# Patient Record
Sex: Female | Born: 1965 | Race: White | Hispanic: No | Marital: Married | Smoking: Never smoker
Health system: Southern US, Community
[De-identification: ages and names within clinical notes are randomized; demographics above are authoritative.]

## PROBLEM LIST (undated history)

## (undated) DIAGNOSIS — Z5189 Encounter for other specified aftercare: Secondary | ICD-10-CM

## (undated) DIAGNOSIS — M6289 Other specified disorders of muscle: Secondary | ICD-10-CM

## (undated) DIAGNOSIS — E785 Hyperlipidemia, unspecified: Secondary | ICD-10-CM

## (undated) DIAGNOSIS — T7840XA Allergy, unspecified, initial encounter: Secondary | ICD-10-CM

## (undated) DIAGNOSIS — I10 Essential (primary) hypertension: Secondary | ICD-10-CM

## (undated) HISTORY — DX: Essential (primary) hypertension: I10

## (undated) HISTORY — DX: Hyperlipidemia, unspecified: E78.5

## (undated) HISTORY — DX: Encounter for other specified aftercare: Z51.89

## (undated) HISTORY — DX: Allergy, unspecified, initial encounter: T78.40XA

---

## 2006-07-26 ENCOUNTER — Ambulatory Visit: Payer: Self-pay | Admitting: Obstetrics and Gynecology

## 2007-09-27 ENCOUNTER — Ambulatory Visit: Payer: Self-pay

## 2008-05-01 ENCOUNTER — Ambulatory Visit: Payer: Self-pay | Admitting: Family Medicine

## 2008-12-17 ENCOUNTER — Ambulatory Visit: Payer: Self-pay

## 2008-12-27 ENCOUNTER — Ambulatory Visit: Payer: Self-pay

## 2009-07-08 ENCOUNTER — Ambulatory Visit: Payer: Self-pay

## 2009-12-19 ENCOUNTER — Ambulatory Visit: Payer: Self-pay

## 2009-12-26 ENCOUNTER — Ambulatory Visit: Payer: Self-pay

## 2010-02-23 ENCOUNTER — Ambulatory Visit: Payer: Self-pay | Admitting: Internal Medicine

## 2010-12-22 ENCOUNTER — Ambulatory Visit: Payer: Self-pay

## 2011-12-24 ENCOUNTER — Ambulatory Visit: Payer: Self-pay

## 2013-03-30 ENCOUNTER — Emergency Department: Payer: Self-pay | Admitting: Emergency Medicine

## 2014-06-26 DIAGNOSIS — Z8639 Personal history of other endocrine, nutritional and metabolic disease: Secondary | ICD-10-CM | POA: Insufficient documentation

## 2014-06-26 DIAGNOSIS — Z8632 Personal history of gestational diabetes: Secondary | ICD-10-CM | POA: Insufficient documentation

## 2014-06-26 DIAGNOSIS — M6289 Other specified disorders of muscle: Secondary | ICD-10-CM | POA: Insufficient documentation

## 2014-06-26 DIAGNOSIS — Z8759 Personal history of other complications of pregnancy, childbirth and the puerperium: Secondary | ICD-10-CM | POA: Insufficient documentation

## 2015-07-02 DIAGNOSIS — I10 Essential (primary) hypertension: Secondary | ICD-10-CM | POA: Insufficient documentation

## 2017-05-07 ENCOUNTER — Other Ambulatory Visit: Payer: Self-pay

## 2017-05-07 DIAGNOSIS — Z0000 Encounter for general adult medical examination without abnormal findings: Secondary | ICD-10-CM

## 2017-05-07 LAB — POCT URINALYSIS DIPSTICK
BILIRUBIN UA: NEGATIVE
Glucose, UA: NEGATIVE
KETONES UA: NEGATIVE
Leukocytes, UA: NEGATIVE
NITRITE UA: NEGATIVE
PH UA: 7.5 (ref 5.0–8.0)
Protein, UA: NEGATIVE
RBC UA: NEGATIVE
Spec Grav, UA: <=1.005 — AB (ref 1.010–1.025)
Urobilinogen, UA: 0.2 E.U./dL

## 2017-05-08 LAB — CMP12+LP+TP+TSH+6AC+CBC/D/PLT
A/G RATIO: 1.6 (ref 1.2–2.2)
ALBUMIN: 4.4 g/dL (ref 3.5–5.5)
ALT: 14 IU/L (ref 0–32)
AST: 20 IU/L (ref 0–40)
Alkaline Phosphatase: 93 IU/L (ref 39–117)
BASOS ABS: 0 10*3/uL (ref 0.0–0.2)
BUN/Creatinine Ratio: 13 (ref 9–23)
BUN: 11 mg/dL (ref 6–24)
Basos: 1 %
Bilirubin Total: 0.3 mg/dL (ref 0.0–1.2)
CALCIUM: 9.4 mg/dL (ref 8.7–10.2)
CHOLESTEROL TOTAL: 198 mg/dL (ref 100–199)
Chloride: 101 mmol/L (ref 96–106)
Chol/HDL Ratio: 3.5 ratio (ref 0.0–4.4)
Creatinine, Ser: 0.84 mg/dL (ref 0.57–1.00)
EOS (ABSOLUTE): 0.1 10*3/uL (ref 0.0–0.4)
Eos: 1 %
Estimated CHD Risk: 0.6 times avg. (ref 0.0–1.0)
FREE THYROXINE INDEX: 1.8 (ref 1.2–4.9)
GFR calc Af Amer: 94 mL/min/{1.73_m2} (ref >59)
GFR calc non Af Amer: 81 mL/min/{1.73_m2} (ref >59)
GGT: 10 IU/L (ref 0–60)
GLOBULIN, TOTAL: 2.7 g/dL (ref 1.5–4.5)
Glucose: 99 mg/dL (ref 65–99)
HDL: 57 mg/dL (ref >39)
Hematocrit: 39.2 % (ref 34.0–46.6)
Hemoglobin: 13.1 g/dL (ref 11.1–15.9)
IMMATURE GRANS (ABS): 0 10*3/uL (ref 0.0–0.1)
IRON: 105 ug/dL (ref 27–159)
Immature Granulocytes: 0 %
LDH: 193 IU/L (ref 119–226)
LDL Calculated: 123 mg/dL — ABNORMAL HIGH (ref 0–99)
LYMPHS: 29 %
Lymphocytes Absolute: 1.7 10*3/uL (ref 0.7–3.1)
MCH: 30.3 pg (ref 26.6–33.0)
MCHC: 33.4 g/dL (ref 31.5–35.7)
MCV: 91 fL (ref 79–97)
MONOS ABS: 0.5 10*3/uL (ref 0.1–0.9)
Monocytes: 9 %
NEUTROS ABS: 3.4 10*3/uL (ref 1.4–7.0)
NEUTROS PCT: 60 %
PHOSPHORUS: 3.4 mg/dL (ref 2.5–4.5)
PLATELETS: 336 10*3/uL (ref 150–379)
POTASSIUM: 4.2 mmol/L (ref 3.5–5.2)
RBC: 4.33 x10E6/uL (ref 3.77–5.28)
RDW: 13.6 % (ref 12.3–15.4)
Sodium: 141 mmol/L (ref 134–144)
T3 UPTAKE RATIO: 26 % (ref 24–39)
T4 TOTAL: 6.8 ug/dL (ref 4.5–12.0)
TRIGLYCERIDES: 91 mg/dL (ref 0–149)
TSH: 2.7 u[IU]/mL (ref 0.450–4.500)
Total Protein: 7.1 g/dL (ref 6.0–8.5)
Uric Acid: 3.7 mg/dL (ref 2.5–7.1)
VLDL Cholesterol Cal: 18 mg/dL (ref 5–40)
WBC: 5.7 10*3/uL (ref 3.4–10.8)

## 2017-05-08 LAB — VITAMIN D 25 HYDROXY (VIT D DEFICIENCY, FRACTURES): Vit D, 25-Hydroxy: 33.4 ng/mL (ref 30.0–100.0)

## 2017-05-19 ENCOUNTER — Ambulatory Visit: Payer: Self-pay | Admitting: Adult Health

## 2017-05-19 ENCOUNTER — Encounter: Payer: Self-pay | Admitting: Adult Health

## 2017-05-19 VITALS — BP 150/100 | HR 82 | Temp 99.1°F | Resp 16 | Ht 65.0 in | Wt 174.0 lb

## 2017-05-19 DIAGNOSIS — Z0000 Encounter for general adult medical examination without abnormal findings: Secondary | ICD-10-CM

## 2017-05-19 MED ORDER — HYDROCHLOROTHIAZIDE 25 MG PO TABS: 25.0000 mg | ORAL_TABLET | Freq: Every day | ORAL | 3 refills | Status: DC

## 2017-05-19 NOTE — Progress Notes (Addendum)
Subjective:    Patient ID: Natalie Vargas, female    DOB: [birth year], 51 y.o.   MRN: [id number]  HPI  Patient is here for annual exam. Last exam year and half away. Physician moved away to further location. Gynecology  last exam February 2017 denies any abnormal's other than history of pelvic floor weakness.  Looking for gynecologist now and her friend has referred her to one that she would like to see now and she will be calling to schedule appointment.  She denies any recent illness and reports she " feels well".  She reports exercising and is active. She drinks mostly water.   She reports she also had a dermatologist and was seen last year 2017 for skin check, she is currently in the process of finding a new dermatologist.   She was previously on Hydrochlorothiazide 25 mg one daily and she stopped this medication after her refills ran out because she no longer has a primary care physician. She reports that Hydrochlorothiazide has kept her blood pressure down well in the past and would like to try this first before trying any other medications.  She was unable totolerate Lisinopril due to cough per her report.  She reports last vision exam was 2 years ago and normal and she will be setting up an appointment for vision exam. She denies any changes in vision. Patient reports she has never had a colonoscopy.  Last Mammogram 2017 per patient and she will be scheduling this years. Denies any abnormal results.  Allergies  Allergen Reactions  . Penicillins Rash  . Sulfa Antibiotics Rash  . Lisinopril Cough   Social History   Social History  . Marital status: Married    Spouse name: N/A  . Number of children: N/A  . Years of education: N/A   Occupational History  . Not on file.   Social History Main Topics  . Smoking status: Never Smoker  . Smokeless tobacco: Never Used  . Alcohol use Not on file  . Drug use: No  . Sexual activity: Yes    Birth control/ protection: Condom      Comment: Vasectomy    Other Topics Concern  . Not on file   Social History Narrative  . No narrative on file   Review of Systems  Constitutional: Positive for fever (99.1 today but none reported at home/ patinet reports " I have been outside and it was hot, she walked over to appointment"). Negative for activity change, appetite change, chills, diaphoresis, fatigue and unexpected weight change.  HENT: Positive for congestion (at times ) and sneezing (daily ). Negative for dental problem, drooling, ear discharge, ear pain, facial swelling, hearing loss, mouth sores, nosebleeds, postnasal drip, rhinorrhea, sinus pain, sinus pressure, sore throat, tinnitus and trouble swallowing.   Eyes: Negative for photophobia, pain, discharge, redness, itching and visual disturbance.  Respiratory: Negative for apnea, cough, choking, chest tightness, shortness of breath, wheezing and stridor.   Cardiovascular: Negative for chest pain, palpitations and leg swelling.  Gastrointestinal: Negative for abdominal distention, abdominal pain, anal bleeding, blood in stool, constipation (occasionally / no meds ), diarrhea, nausea, rectal pain and vomiting.  Endocrine: Negative for cold intolerance, heat intolerance, polydipsia, polyphagia and polyuria.  Genitourinary: Positive for urgency (history of pelvic floor weakness/ Physical therapy for this in past and has improvements / Kegels/ increases with caffiene.). Negative for decreased urine volume, difficulty urinating, dyspareunia, dysuria, enuresis, flank pain, frequency, genital sores, hematuria, menstrual problem, pelvic pain, vaginal bleeding, vaginal  discharge and vaginal pain.  Musculoskeletal: Positive for arthralgias ("very mild now after physical therapy in right hip" has been x-ray). Negative for back pain, gait problem, joint swelling, myalgias, neck pain and neck stiffness.  Skin: Negative for color change, pallor, rash and wound.  Allergic/Immunologic:  Positive for environmental allergies. Negative for food allergies and immunocompromised state.  Neurological: Negative for dizziness, tremors, seizures, syncope, facial asymmetry, speech difficulty, weakness, light-headedness, numbness and headaches.  Hematological: Negative for adenopathy. Does not bruise/bleed easily.  Psychiatric/Behavioral: Negative for agitation, behavioral problems, confusion, decreased concentration, dysphoric mood, hallucinations, self-injury, sleep disturbance and suicidal ideas. The patient is not nervous/anxious and is not hyperactive.    No gynecology exams done in this office at this time/ patient is aware she will have to see gynecology if needed for pap smear and pelvic.        Objective:   Physical Exam  Constitutional: She is oriented to person, place, and time. She appears well-developed and well-nourished. She is active.  Non-toxic appearance. She does not have a sickly appearance. No distress.  HENT:  Head: Normocephalic and atraumatic.  Right Ear: External ear normal.  Left Ear: External ear normal.  Nose: Nose normal.  Mouth/Throat: Oropharynx is clear and moist. No oropharyngeal exudate.  Eyes: Pupils are equal, round, and reactive to light. Conjunctivae and EOM are normal. Right eye exhibits no discharge. Left eye exhibits no discharge. No scleral icterus.  Neck: Normal range of motion. Neck supple. No JVD present. No tracheal deviation present. No thyromegaly present.  Cardiovascular: Normal rate, regular rhythm, normal heart sounds and intact distal pulses.  Exam reveals no gallop and no friction rub.   No murmur heard. Pulmonary/Chest: Effort normal and breath sounds normal. No stridor. No respiratory distress. She has no wheezes. She has no rales. She exhibits no tenderness.  Abdominal: Soft. Bowel sounds are normal. She exhibits no distension and no mass. There is no tenderness. There is no rebound and no guarding.  Musculoskeletal: Normal range  of motion. She exhibits no edema, tenderness or deformity.  Patient moves on and off exam table without difficulty. She ambulates without difficulty.   Lymphadenopathy:       Head (right side): No submental, no submandibular, no tonsillar, no preauricular, no posterior auricular and no occipital adenopathy present.       Head (left side): No submental, no submandibular, no tonsillar, no preauricular, no posterior auricular and no occipital adenopathy present.    She has no cervical adenopathy.       Right cervical: No superficial cervical, no deep cervical and no posterior cervical adenopathy present.      Left cervical: No superficial cervical, no deep cervical and no posterior cervical adenopathy present.    She has no axillary adenopathy.       Right axillary: No pectoral and no lateral adenopathy present.       Left axillary: No pectoral and no lateral adenopathy present.      Right: No inguinal, no supraclavicular and no epitrochlear adenopathy present.       Left: No inguinal, no supraclavicular and no epitrochlear adenopathy present.  Neurological: She is alert and oriented to person, place, and time. She has normal reflexes. She displays normal reflexes. No cranial nerve deficit or sensory deficit. She exhibits normal muscle tone. Coordination and gait normal. GCS eye subscore is 4. GCS verbal subscore is 5. GCS motor subscore is 6.  Skin: Skin is warm and dry. She is not diaphoretic. No pallor.  Psychiatric: She has a normal mood and affect. Her behavior is normal. Judgment and thought content normal. Cognition and memory are normal.  Vitals reviewed.         Assessment & Plan:   1. Blood Pressure is Elevated: Will resume HCTZ 25mg  one tablet daily today. Patient will monitor blood pressure at home daily and call the office or seek Emergency care if after hours and blood pressure remains high. Discussed emergency care for any chest pain, arm pain, radiation to neck, sob or nausea as  well as other associated symptoms. Patient will also be seen again in clinic week of 7/16 for office visit and recheck of blood pressure.  2. Patient will schedule Gynecology appointment and call the office if her contact does not work out as office will give referral. Patient will schedule Mammogram  3. Temperature 99.1 oral in office, patient will monitor at home and call the office if not resolved. Patient verbalized understanding.  4. LDL mildly elevated, discussed healthy diet. Will follow.  4. Patient will follow up with the office as needed and she verbalizes understanding.  Scheduled Meds: Continuous Infusions:  Meds ordered this encounter  Medications  . Calcium Carbonate-Vitamin D 600-400 MG-UNIT tablet    Sig: Take 1 tablet by mouth daily.   . Omega-3 Fatty Acids (FISH OIL) 1000 MG CAPS    Sig: Take 1,000 mg by mouth daily.  . hydrochlorothiazide (HYDRODIURIL) 25 MG tablet    Sig: Take 1 tablet (25 mg total) by mouth daily.    Dispense:  90 tablet    Refill:  3   She is also taking another Vitamin D3 over the counter- she will verify dosage.

## 2017-05-20 ENCOUNTER — Ambulatory Visit: Payer: Self-pay | Admitting: Medical

## 2017-05-21 ENCOUNTER — Telehealth: Payer: Self-pay

## 2017-05-21 NOTE — Telephone Encounter (Signed)
Please see Outgoing phone message.

## 2017-05-21 NOTE — Patient Instructions (Addendum)
Blood Pressure is Elevated: Will resume HCTZ 25mg  one tablet daily today. Patient will monitor blood pressure at home daily and call the office or seek Emergency care if after hours and blood pressure remains high. Discussed emergency care for any chest pain, arm pain, radiation to neck, sob or nausea as well as other associated symptoms. Patient will also be seen again in clinic week of 7/16 for office visit and recheck of blood pressure.  Fat and Cholesterol Restricted Diet Getting too much fat and cholesterol in your diet may cause health problems. Following this diet helps keep your fat and cholesterol at normal levels. This can keep you from getting sick. What types of fat should I choose?  Choose monosaturated and polyunsaturated fats. These are found in foods such as olive oil, canola oil, flaxseeds, walnuts, almonds, and seeds.  Eat more omega-3 fats. Good choices include salmon, mackerel, sardines, tuna, flaxseed oil, and ground flaxseeds.  Limit saturated fats. These are in animal products such as meats, butter, and cream. They can also be in plant products such as palm oil, palm kernel oil, and coconut oil.  Avoid foods with partially hydrogenated oils in them. These contain trans fats. Examples of foods that have trans fats are stick margarine, some tub margarines, cookies, crackers, and other baked goods. What general guidelines do I need to follow?  Check food labels. Look for the words "trans fat" and "saturated fat."  When preparing a meal: ? Fill half of your plate with vegetables and green salads. ? Fill one fourth of your plate with whole grains. Look for the word "whole" as the first word in the ingredient list. ? Fill one fourth of your plate with lean protein foods.  Eat more foods that have fiber, like apples, carrots, beans, peas, and barley.  Eat more home-cooked foods. Eat less at restaurants and buffets.  Limit or avoid alcohol.  Limit foods high in starch and  sugar.  Limit fried foods.  Cook foods without frying them. Baking, boiling, grilling, and broiling are all great options.  Lose weight if you are overweight. Losing even a small amount of weight can help your overall health. It can also help prevent diseases such as diabetes and heart disease. What foods can I eat? Grains Whole grains, such as whole wheat or whole grain breads, crackers, cereals, and pasta. Unsweetened oatmeal, bulgur, barley, quinoa, or brown rice. Corn or whole wheat flour tortillas. Vegetables Fresh or frozen vegetables (raw, steamed, roasted, or grilled). Green salads. Fruits All fresh, canned (in natural juice), or frozen fruits. Meat and Other Protein Products Ground beef (85% or leaner), grass-fed beef, or beef trimmed of fat. Skinless chicken or [place]. Ground chicken or [place]. Pork trimmed of fat. All fish and seafood. Eggs. Dried beans, peas, or lentils. Unsalted nuts or seeds. Unsalted canned or dry beans. Dairy Low-fat dairy products, such as skim or 1% milk, 2% or reduced-fat cheeses, low-fat ricotta or cottage cheese, or plain low-fat yogurt. Fats and Oils Tub margarines without trans fats. Light or reduced-fat mayonnaise and salad dressings. Avocado. Olive, canola, sesame, or safflower oils. Natural peanut or almond butter (choose ones without added sugar and oil). The items listed above may not be a complete list of recommended foods or beverages. Contact your dietitian for more options. What foods are not recommended? Grains White bread. White pasta. White rice. Cornbread. Bagels, pastries, and croissants. Crackers that contain trans fat. Vegetables White potatoes. Corn. Creamed or fried vegetables. Vegetables in a cheese  sauce. Fruits Dried fruits. Canned fruit in light or heavy syrup. Fruit juice. Meat and Other Protein Products Fatty cuts of meat. Ribs, chicken wings, bacon, sausage, bologna, salami, chitterlings, fatback, hot dogs, bratwurst, and  packaged luncheon meats. Liver and organ meats. Dairy Whole or 2% milk, cream, half-and-half, and cream cheese. Whole milk cheeses. Whole-fat or sweetened yogurt. Full-fat cheeses. Nondairy creamers and whipped toppings. Processed cheese, cheese spreads, or cheese curds. Sweets and Desserts Corn syrup, sugars, honey, and molasses. Candy. Jam and jelly. Syrup. Sweetened cereals. Cookies, pies, cakes, donuts, muffins, and ice cream. Fats and Oils Butter, stick margarine, lard, shortening, ghee, or bacon fat. Coconut, palm kernel, or palm oils. Beverages Alcohol. Sweetened drinks (such as sodas, lemonade, and fruit drinks or punches). The items listed above may not be a complete list of foods and beverages to avoid. Contact your dietitian for more information. This information is not intended to replace advice given to you by your health care provider. Make sure you discuss any questions you have with your health care provider. Document Released: 04/26/2012 Document Revised: 07/02/2016 Document Reviewed: 01/25/2014 Elsevier Interactive Patient Education  2018 [employer].  Breast Self-Awareness Breast self-awareness means being familiar with how your breasts look and feel. It involves checking your breasts regularly and reporting any changes to your health care provider. Practicing breast self-awareness is important. A change in your breasts can be a sign of a serious medical problem. Being familiar with how your breasts look and feel allows you to find any problems early, when treatment is more likely to be successful. All women should practice breast self-awareness, including women who have had breast implants. How to do a breast self-exam One way to learn what is normal for your breasts and whether your breasts are changing is to do a breast self-exam. To do a breast self-exam: Look for Changes  1. Remove all the clothing above your waist. 2. Stand in front of a mirror in a room with good  lighting. 3. Put your hands on your hips. 4. Push your hands firmly downward. 5. Compare your breasts in the mirror. Look for differences between them (asymmetry), such as: ? Differences in shape. ? Differences in size. ? Puckers, dips, and bumps in one breast and not the other. 6. Look at each breast for changes in your skin, such as: ? Redness. ? Scaly areas. 7. Look for changes in your nipples, such as: ? Discharge. ? Bleeding. ? Dimpling. ? Redness. ? A change in position. Feel for Changes  Carefully feel your breasts for lumps and changes. It is best to do this while lying on your back on the floor and again while sitting or standing in the shower or tub with soapy water on your skin. Feel each breast in the following way:  Place the arm on the side of the breast you are examining above your head.  Feel your breast with the other hand.  Start in the nipple area and make  inch (2 cm) overlapping circles to feel your breast. Use the pads of your three middle fingers to do this. Apply light pressure, then medium pressure, then firm pressure. The light pressure will allow you to feel the tissue closest to the skin. The medium pressure will allow you to feel the tissue that is a little deeper. The firm pressure will allow you to feel the tissue close to the ribs.  Continue the overlapping circles, moving downward over the breast until you feel your ribs below  your breast.  Move one finger-width toward the center of the body. Continue to use the  inch (2 cm) overlapping circles to feel your breast as you move slowly up toward your collarbone.  Continue the up and down exam using all three pressures until you reach your armpit.  Write Down What You Find  Write down what is normal for each breast and any changes that you find. Keep a written record with breast changes or normal findings for each breast. By writing this information down, you do not need to depend only on memory for  size, tenderness, or location. Write down where you are in your menstrual cycle, if you are still menstruating. If you are having trouble noticing differences in your breasts, do not get discouraged. With time you will become more familiar with the variations in your breasts and more comfortable with the exam. How often should I examine my breasts? Examine your breasts every month. If you are breastfeeding, the best time to examine your breasts is after a feeding or after using a breast pump. If you menstruate, the best time to examine your breasts is 5-7 days after your period is over. During your period, your breasts are lumpier, and it may be more difficult to notice changes. When should I see my health care provider? See your health care provider if you notice:  A change in shape or size of your breasts or nipples.  A change in the skin of your breast or nipples, such as a reddened or scaly area.  Unusual discharge from your nipples.  A lump or thick area that was not there before.  Pain in your breasts.  Anything that concerns you.  This information is not intended to replace advice given to you by your health care provider. Make sure you discuss any questions you have with your health care provider. Document Released: 10/26/2005 Document Revised: 04/02/2016 Document Reviewed: 09/15/2015 Elsevier Interactive Patient Education  2018 [employer].  Mammogram A mammogram is an X-ray of the breasts that is done to check for changes that are not normal. This test can screen for and find any changes that may suggest breast cancer. This test can also help to find other changes and variations in the breast. What happens before the procedure?  Have this test done about 1-2 weeks after your period. This is usually when your breasts are the least tender.  If you are visiting a new doctor or clinic, send any past mammogram images to your new doctor's office.  Wash your breasts and under your  arms the day of the test.  Do not use deodorants, perfumes, lotions, or powders on the day of the test.  Take off any jewelry from your neck.  Wear clothes that you can change into and out of easily. What happens during the procedure?  You will undress from the waist up. You will put on a gown.  You will stand in front of the X-ray machine.  Each breast will be placed between two plastic or glass plates. The plates will press down on your breast for a few seconds. Try to stay as relaxed as possible. This does not cause any harm to your breasts. Any discomfort you feel will be very brief.  X-rays will be taken from different angles of each breast. The procedure may vary among doctors and hospitals. What happens after the procedure?  The mammogram will be looked at by a specialist (radiologist).  You may need to do  certain parts of the test again. This depends on the quality of the images.  Ask when your test results will be ready. Make sure you get your test results.  You may go back to your normal activities. This information is not intended to replace advice given to you by your health care provider. Make sure you discuss any questions you have with your health care provider. Document Released: 01/22/2009 Document Revised: 04/02/2016 Document Reviewed: 01/04/2015 Elsevier Interactive Patient Education  2018 [employer] Blood Pressure Record Sheet Your blood pressure on this visit to the emergency department or clinic is elevated. This does not necessarily mean you have high blood pressure (hypertension), but it does mean that your blood pressure needs to be rechecked. Many times your blood pressure can increase due to illness, pain, anxiety, or other factors. We recommend that you get a series of blood pressure readings done over a period of 5 days. It is best to get a reading in the morning and one in the evening. You should make sure to sit and relax for 1-5 minutes before the  reading is taken. Write the readings down and make a follow-up appointment with your health care provider to discuss the results. If there is not a free clinic or a drug store with a blood-pressure-taking machine near you, you can purchase blood-pressure-taking equipment from a drug store. Having one in the home allows you the convenience of taking your blood pressure while you are home and relaxed. Blood Pressure Log Date: _______________________  a.m. _____________________  p.m. _____________________  Date: _______________________  a.m. _____________________  p.m. _____________________  Date: _______________________  a.m. _____________________  p.m. _____________________  Date: _______________________  a.m. _____________________  p.m. _____________________  Date: _______________________  a.m. _____________________  p.m. _____________________  This information is not intended to replace advice given to you by your health care provider. Make sure you discuss any questions you have with your health care provider. Document Released: 07/25/2003 Document Revised: 10/09/2016 Document Reviewed: 12/19/2013 Elsevier Interactive Patient Education  2018 [employer].  Cholesterol Cholesterol is a white, waxy, fat-like substance that is needed by the human body in small amounts. The liver makes all the cholesterol we need. Cholesterol is carried from the liver by the blood through the blood vessels. Deposits of cholesterol (plaques) may build up on blood vessel (artery) walls. Plaques make the arteries narrower and stiffer. Cholesterol plaques increase the risk for heart attack and stroke. You cannot feel your cholesterol level even if it is very high. The only way to know that it is high is to have a blood test. Once you know your cholesterol levels, you should keep a record of the test results. Work with your health care provider to keep your levels in the desired range. What do the  results mean?  Total cholesterol is a rough measure of all the cholesterol in your blood.  LDL (low-density lipoprotein) is the "bad" cholesterol. This is the type that causes plaque to build up on the artery walls. You want this level to be low.  HDL (high-density lipoprotein) is the "good" cholesterol because it cleans the arteries and carries the LDL away. You want this level to be high.  Triglycerides are fat that the body can either burn for energy or store. High levels are closely linked to heart disease. What are the desired levels of cholesterol?  Total cholesterol below 200.  LDL below 100 for people who are at risk, below 70 for people at very high risk.  HDL above 40 is good. A level of 60 or higher is considered to be protective against heart disease.  Triglycerides below 150. How can I lower my cholesterol? Diet Follow your diet program as told by your health care provider.  Choose fish or white meat chicken and [place], roasted or baked. Limit fatty cuts of red meat, fried foods, and processed meats, such as sausage and lunch meats.  Eat lots of fresh fruits and vegetables.  Choose whole grains, beans, pasta, potatoes, and cereals.  Choose olive oil, corn oil, or canola oil, and use only small amounts.  Avoid butter, mayonnaise, shortening, or palm kernel oils.  Avoid foods with trans fats.  Drink skim or nonfat milk and eat low-fat or nonfat yogurt and cheeses. Avoid whole milk, cream, ice cream, egg yolks, and full-fat cheeses.  Healthier desserts include angel food cake, ginger snaps, animal crackers, hard candy, popsicles, and low-fat or nonfat frozen yogurt. Avoid pastries, cakes, pies, and cookies.  Exercise  Follow your exercise program as told by your health care provider. A regular program: ? Helps to decrease LDL and raise HDL. ? Helps with weight control.  Do things that increase your activity level, such as gardening, walking, and taking the  stairs.  Ask your health care provider about ways that you can be more active in your daily life.  Medicine  Take over-the-counter and prescription medicines only as told by your health care provider. ? Medicine may be prescribed by your health care provider to help lower cholesterol and decrease the risk for heart disease. This is usually done if diet and exercise have failed to bring down cholesterol levels. ? If you have several risk factors, you may need medicine even if your levels are normal.  This information is not intended to replace advice given to you by your health care provider. Make sure you discuss any questions you have with your health care provider. Document Released: 07/21/2001 Document Revised: 05/23/2016 Document Reviewed: 04/25/2016 Elsevier Interactive Patient Education  2017 [employer]  Hypertension Hypertension is another name for high blood pressure. High blood pressure forces your heart to work harder to pump blood. This can cause problems over time. There are two numbers in a blood pressure reading. There is a top number (systolic) over a bottom number (diastolic). It is best to have a blood pressure below 120/80. Healthy choices can help lower your blood pressure. You may need medicine to help lower your blood pressure if:  Your blood pressure cannot be lowered with healthy choices.  Your blood pressure is higher than 130/80.  Follow these instructions at home: Eating and drinking  If directed, follow the DASH eating plan. This diet includes: ? Filling half of your plate at each meal with fruits and vegetables. ? Filling one quarter of your plate at each meal with whole grains. Whole grains include whole wheat pasta, brown rice, and whole grain bread. ? Eating or drinking low-fat dairy products, such as skim milk or low-fat yogurt. ? Filling one quarter of your plate at each meal with low-fat (lean) proteins. Low-fat proteins include fish, skinless  chicken, eggs, beans, and tofu. ? Avoiding fatty meat, cured and processed meat, or chicken with skin. ? Avoiding premade or processed food.  Eat less than 1,500 mg of salt (sodium) a day.  Limit alcohol use to no more than 1 drink a day for nonpregnant women and 2 drinks a day for men. One drink equals 12 oz of beer, 5 oz  of wine, or 1 oz of hard liquor. Lifestyle  Work with your doctor to stay at a healthy weight or to lose weight. Ask your doctor what the best weight is for you.  Get at least 30 minutes of exercise that causes your heart to beat faster (aerobic exercise) most days of the week. This may include walking, swimming, or biking.  Get at least 30 minutes of exercise that strengthens your muscles (resistance exercise) at least 3 days a week. This may include lifting weights or pilates.  Do not use any products that contain nicotine or tobacco. This includes cigarettes and e-cigarettes. If you need help quitting, ask your doctor.  Check your blood pressure at home as told by your doctor.  Keep all follow-up visits as told by your doctor. This is important. Medicines  Take over-the-counter and prescription medicines only as told by your doctor. Follow directions carefully.  Do not skip doses of blood pressure medicine. The medicine does not work as well if you skip doses. Skipping doses also puts you at risk for problems.  Ask your doctor about side effects or reactions to medicines that you should watch for. Contact a doctor if:  You think you are having a reaction to the medicine you are taking.  You have headaches that keep coming back (recurring).  You feel dizzy.  You have swelling in your ankles.  You have trouble with your vision. Get help right away if:  You get a very bad headache.  You start to feel confused.  You feel weak or numb.  You feel faint.  You get very bad pain in your: ? Chest. ? Belly (abdomen).  You throw up (vomit) more than  once.  You have trouble breathing. Summary  Hypertension is another name for high blood pressure.  Making healthy choices can help lower blood pressure. If your blood pressure cannot be controlled with healthy choices, you may need to take medicine. This information is not intended to replace advice given to you by your health care provider. Make sure you discuss any questions you have with your health care provider. Document Released: 04/13/2008 Document Revised: 09/23/2016 Document Reviewed: 09/23/2016 Elsevier Interactive Patient Education  2018 [employer]  Low-Sodium Eating Plan Sodium, which is an element that makes up salt, helps you maintain a healthy balance of fluids in your body. Too much sodium can increase your blood pressure and cause fluid and waste to be held in your body. Your health care provider or dietitian may recommend following this plan if you have high blood pressure (hypertension), kidney disease, liver disease, or heart failure. Eating less sodium can help lower your blood pressure, reduce swelling, and protect your heart, liver, and kidneys. What are tips for following this plan? General guidelines Most people on this plan should limit their sodium intake to 1,500-2,000 mg (milligrams) of sodium each day. Reading food labels The Nutrition Facts label lists the amount of sodium in one serving of the food. If you eat more than one serving, you must multiply the listed amount of sodium by the number of servings. Choose foods with less than 140 mg of sodium per serving. Avoid foods with 300 mg of sodium or more per serving. Shopping Look for lower-sodium products, often labeled as "low-sodium" or "no salt added." Always check the sodium content even if foods are labeled as "unsalted" or "no salt added". Buy fresh foods. Avoid canned foods and premade or frozen meals. Avoid canned, cured, or processed meats  Buy breads that have less than 80 mg of sodium per  slice. Cooking Eat more home-cooked food and less restaurant, buffet, and fast food. Avoid adding salt when cooking. Use salt-free seasonings or herbs instead of table salt or sea salt. Check with your health care provider or pharmacist before using salt substitutes. Cook with plant-based oils, such as canola, sunflower, or olive oil. Meal planning When eating at a restaurant, ask that your food be prepared with less salt or no salt, if possible. Avoid foods that contain MSG (monosodium glutamate). MSG is sometimes added to [place] food, bouillon, and some canned foods. What foods are recommended? The items listed may not be a complete list. Talk with your dietitian about what dietary choices are best for you. Grains Low-sodium cereals, including oats, puffed wheat and rice, and shredded wheat. Low-sodium crackers. Unsalted rice. Unsalted pasta. Low-sodium bread. Whole-grain breads and whole-grain pasta. Vegetables Fresh or frozen vegetables. "No salt added" canned vegetables. "No salt added" tomato sauce and paste. Low-sodium or reduced-sodium tomato and vegetable juice. Fruits Fresh, frozen, or canned fruit. Fruit juice. Meats and other protein foods Fresh or frozen (no salt added) meat, poultry, seafood, and fish. Low-sodium canned tuna and salmon. Unsalted nuts. Dried peas, beans, and lentils without added salt. Unsalted canned beans. Eggs. Unsalted nut butters. Dairy Milk. Soy milk. Cheese that is naturally low in sodium, such as ricotta cheese, fresh mozzarella, or Swiss cheese Low-sodium or reduced-sodium cheese. Cream cheese. Yogurt. Fats and oils Unsalted butter. Unsalted margarine with no trans fat. Vegetable oils such as canola or olive oils. Seasonings and other foods Fresh and dried herbs and spices. Salt-free seasonings. Low-sodium mustard and ketchup. Sodium-free salad dressing. Sodium-free light mayonnaise. Fresh or refrigerated horseradish. Lemon juice. Vinegar. Homemade,  reduced-sodium, or low-sodium soups. Unsalted popcorn and pretzels. Low-salt or salt-free chips. What foods are not recommended? The items listed may not be a complete list. Talk with your dietitian about what dietary choices are best for you. Grains Instant hot cereals. Bread stuffing, pancake, and biscuit mixes. Croutons. Seasoned rice or pasta mixes. Noodle soup cups. Boxed or frozen macaroni and cheese. Regular salted crackers. Self-rising flour. Vegetables Sauerkraut, pickled vegetables, and relishes. Olives. [place] fries. Onion rings. Regular canned vegetables (not low-sodium or reduced-sodium). Regular canned tomato sauce and paste (not low-sodium or reduced-sodium). Regular tomato and vegetable juice (not low-sodium or reduced-sodium). Frozen vegetables in sauces. Meats and other protein foods Meat or fish that is salted, canned, smoked, spiced, or pickled. Bacon, ham, sausage, hotdogs, corned beef, chipped beef, packaged lunch meats, salt pork, jerky, pickled herring, anchovies, regular canned tuna, sardines, salted nuts. Dairy Processed cheese and cheese spreads. Cheese curds. Blue cheese. Feta cheese. String cheese. Regular cottage cheese. Buttermilk. Canned milk. Fats and oils Salted butter. Regular margarine. Ghee. Bacon fat. Seasonings and other foods Onion salt, garlic salt, seasoned salt, table salt, and sea salt. Canned and packaged gravies. Worcestershire sauce. Tartar sauce. Barbecue sauce. Teriyaki sauce. Soy sauce, including reduced-sodium. Steak sauce. Fish sauce. Oyster sauce. Cocktail sauce. Horseradish that you find on the shelf. Regular ketchup and mustard. Meat flavorings and tenderizers. Bouillon cubes. Hot sauce and Tabasco sauce. Premade or packaged marinades. Premade or packaged taco seasonings. Relishes. Regular salad dressings. Salsa. Potato and tortilla chips. Corn chips and puffs. Salted popcorn and pretzels. Canned or dried soups. Pizza. Frozen entrees and pot  pies. Summary Eating less sodium can help lower your blood pressure, reduce swelling, and protect your heart, liver, and kidneys. Most people on  this plan should limit their sodium intake to 1,500-2,000 mg (milligrams) of sodium each day. Canned, boxed, and frozen foods are high in sodium. Restaurant foods, fast foods, and pizza are also very high in sodium. You also get sodium by adding salt to food. Try to cook at home, eat more fresh fruits and vegetables, and eat less fast food, canned, processed, or prepared foods. This information is not intended to replace advice given to you by your health care provider. Make sure you discuss any questions you have with your health care provider. Document Released: 04/17/2002 Document Revised: 10/19/2016 Document Reviewed: 10/19/2016 [employer] Interactive Patient Education  2017 [employer]

## 2017-05-25 ENCOUNTER — Telehealth: Payer: Self-pay

## 2017-05-25 NOTE — Telephone Encounter (Signed)
See telephone call notes

## 2017-05-28 ENCOUNTER — Ambulatory Visit: Payer: Self-pay

## 2017-05-28 VITALS — BP 122/88 | HR 74

## 2017-05-28 DIAGNOSIS — Z0130 Encounter for examination of blood pressure without abnormal findings: Secondary | ICD-10-CM

## 2017-07-01 ENCOUNTER — Ambulatory Visit: Payer: Self-pay | Admitting: Adult Health

## 2017-07-01 ENCOUNTER — Encounter: Payer: Self-pay | Admitting: Adult Health

## 2017-07-01 VITALS — BP 122/82 | HR 74 | Temp 98.3°F

## 2017-07-01 DIAGNOSIS — I10 Essential (primary) hypertension: Secondary | ICD-10-CM

## 2017-07-01 DIAGNOSIS — E559 Vitamin D deficiency, unspecified: Secondary | ICD-10-CM

## 2017-07-01 DIAGNOSIS — Z0130 Encounter for examination of blood pressure without abnormal findings: Secondary | ICD-10-CM

## 2017-07-01 NOTE — Patient Instructions (Signed)
Heart-Healthy Eating Plan Heart-healthy meal planning includes:  Limiting unhealthy fats.  Increasing healthy fats.  Making other small dietary changes.  You may need to talk with your doctor or a diet specialist (dietitian) to create an eating plan that is right for you. What types of fat should I choose?  Choose healthy fats. These include olive oil and canola oil, flaxseeds, walnuts, almonds, and seeds.  Eat more omega-3 fats. These include salmon, mackerel, sardines, tuna, flaxseed oil, and ground flaxseeds. Try to eat fish at least twice each week.  Limit saturated fats. ? Saturated fats are often found in animal products, such as meats, butter, and cream. ? Plant sources of saturated fats include palm oil, palm kernel oil, and coconut oil.  Avoid foods with partially hydrogenated oils in them. These include stick margarine, some tub margarines, cookies, crackers, and other baked goods. These contain trans fats. What general guidelines do I need to follow?  Check food labels carefully. Identify foods with trans fats or high amounts of saturated fat.  Fill one half of your plate with vegetables and green salads. Eat 4-5 servings of vegetables per day. A serving of vegetables is: ? 1 cup of raw leafy vegetables. ?  cup of raw or cooked cut-up vegetables. ?  cup of vegetable juice.  Fill one fourth of your plate with whole grains. Look for the word "whole" as the first word in the ingredient list.  Fill one fourth of your plate with lean protein foods.  Eat 4-5 servings of fruit per day. A serving of fruit is: ? One medium whole fruit. ?  cup of dried fruit. ?  cup of fresh, frozen, or canned fruit. ?  cup of 100% fruit juice.  Eat more foods that contain soluble fiber. These include apples, broccoli, carrots, beans, peas, and barley. Try to get 20-30 g of fiber per day.  Eat more home-cooked food. Eat less restaurant, buffet, and fast food.  Limit or avoid  alcohol.  Limit foods high in starch and sugar.  Avoid fried foods.  Avoid frying your food. Try baking, boiling, grilling, or broiling it instead. You can also reduce fat by: ? Removing the skin from poultry. ? Removing all visible fats from meats. ? Skimming the fat off of stews, soups, and gravies before serving them. ? Steaming vegetables in water or broth.  Lose weight if you are overweight.  Eat 4-5 servings of nuts, legumes, and seeds per week: ? One serving of dried beans or legumes equals  cup after being cooked. ? One serving of nuts equals 1 ounces. ? One serving of seeds equals  ounce or one tablespoon.  You may need to keep track of how much salt or sodium you eat. This is especially true if you have high blood pressure. Talk with your doctor or dietitian to get more information. What foods can I eat? Grains Breads, including French, white, pita, wheat, raisin, rye, oatmeal, and Italian. Tortillas that are neither fried nor made with lard or trans fat. Low-fat rolls, including hotdog and hamburger buns and English muffins. Biscuits. Muffins. Waffles. Pancakes. Light popcorn. Whole-grain cereals. Flatbread. Melba toast. Pretzels. Breadsticks. Rusks. Low-fat snacks. Low-fat crackers, including oyster, saltine, matzo, graham, animal, and rye. Rice and pasta, including brown rice and pastas that are made with whole wheat. Vegetables All vegetables. Fruits All fruits, but limit coconut. Meats and Other Protein Sources Lean, well-trimmed beef, veal, pork, and lamb. Chicken and turkey without skin. All fish and shellfish.   Wild duck, rabbit, pheasant, and venison. Egg whites or low-cholesterol egg substitutes. Dried beans, peas, lentils, and tofu. Seeds and most nuts. Dairy Low-fat or nonfat cheeses, including ricotta, string, and mozzarella. Skim or 1% milk that is liquid, powdered, or evaporated. Buttermilk that is made with low-fat milk. Nonfat or low-fat  yogurt. Beverages Mineral water. Diet carbonated beverages. Sweets and Desserts Sherbets and fruit ices. Honey, jam, marmalade, jelly, and syrups. Meringues and gelatins. Pure sugar candy, such as hard candy, jelly beans, gumdrops, mints, marshmallows, and small amounts of dark chocolate. [employer]. Eat all sweets and desserts in moderation. Fats and Oils Nonhydrogenated (trans-free) margarines. Vegetable oils, including soybean, sesame, sunflower, olive, peanut, safflower, corn, canola, and cottonseed. Salad dressings or mayonnaise made with a vegetable oil. Limit added fats and oils that you use for cooking, baking, salads, and as spreads. Other Cocoa powder. Coffee and tea. All seasonings and condiments. The items listed above may not be a complete list of recommended foods or beverages. Contact your dietitian for more options. What foods are not recommended? Grains Breads that are made with saturated or trans fats, oils, or whole milk. Croissants. Butter rolls. Cheese breads. Sweet rolls. Donuts. Buttered popcorn. Chow mein noodles. High-fat crackers, such as cheese or butter crackers. Meats and Other Protein Sources Fatty meats, such as hotdogs, short ribs, sausage, spareribs, bacon, rib eye roast or steak, and mutton. High-fat deli meats, such as salami and bologna. Caviar. Domestic duck and goose. Organ meats, such as kidney, liver, sweetbreads, and heart. Dairy Cream, sour cream, cream cheese, and creamed cottage cheese. Whole-milk cheeses, including blue (bleu), [address], [place], [place], [address], [place], [address], cheddar, [place], and [place]. Whole or 2% milk that is liquid, evaporated, or condensed. Whole buttermilk. Cream sauce or high-fat cheese sauce. Yogurt that is made from whole milk. Beverages Regular sodas and juice drinks with added sugar. Sweets and Desserts Frosting. Pudding. Cookies. Cakes other than angel food cake. Candy that has milk chocolate or white  chocolate, hydrogenated fat, butter, coconut, or unknown ingredients. Buttered syrups. Full-fat ice cream or ice cream drinks. Fats and Oils Gravy that has suet, meat fat, or shortening. Cocoa butter, hydrogenated oils, palm oil, coconut oil, palm kernel oil. These can often be found in baked products, candy, fried foods, nondairy creamers, and whipped toppings. Solid fats and shortenings, including bacon fat, salt pork, lard, and butter. Nondairy cream substitutes, such as coffee creamers and sour cream substitutes. Salad dressings that are made of unknown oils, cheese, or sour cream. The items listed above may not be a complete list of foods and beverages to avoid. Contact your dietitian for more information. This information is not intended to replace advice given to you by your health care provider. Make sure you discuss any questions you have with your health care provider. Document Released: 04/26/2012 Document Revised: 04/02/2016 Document Reviewed: 04/19/2014 Elsevier Interactive Patient Education  2018 [employer]. Cooking With Less [employer] with less salt is one way to reduce the amount of sodium you get from food. Depending on your condition and overall health, your health care provider or diet and nutrition specialist (dietitian) may recommend that you reduce your sodium intake. Most people should have less than 2,300 milligrams (mg) of sodium each day. If you have high blood pressure (hypertension), you may need to limit your sodium to 1,500 mg each day. Follow the tips below to help reduce your sodium intake. What do I need to know about cooking with less salt? Shopping  Buy sodium-free or low-sodium products. Look for the following words on food labels: ? Low-sodium. ? Sodium-free. ? Reduced-sodium. ? No salt added. ? Unsalted.  Buy fresh or frozen vegetables. Avoid canned vegetables.  Avoid buying meats or protein foods that have been injected with broth or saline  solution.  Avoid cured or smoked meats, such as hot dogs, bacon, salami, ham, and bologna. Reading food labels  Check the food label before buying or using packaged ingredients.  Look for products with no more than 140 mg of sodium in one serving.  Do not choose foods with salt as one of the first three ingredients on the ingredients list. If salt is one of the first three ingredients, it usually means the item is high in sodium, because ingredients are listed in order of amount in the food item. Cooking  Use herbs, seasonings without salt, and spices as substitutes for salt in foods.  Use sodium-free baking soda when baking.  Grill, braise, or roast foods to add flavor with less salt.  Avoid adding salt to pasta, rice, or hot cereals while cooking.  Drain and rinse canned vegetables before use.  Avoid adding salt when cooking sweets and desserts.  Cook with low-sodium ingredients. What are some salt alternatives? The following are herbs, seasonings, and spices that can be used instead of salt to give taste to your food. Herbs should be fresh or dried. Do not choose packaged mixes. Next to the name of the herb, spice, or seasoning are some examples of foods you can pair it with. Herbs  Bay leaves - Soups, meat and vegetable dishes, and spaghetti sauce.  Basil - [place] dishes, soups, pasta, and fish dishes.  Cilantro - Meat, poultry, and vegetable dishes.  Chili powder - Marinades and Mexican dishes.  Chives - Salad dressings and potato dishes.  Cumin - Mexican dishes, couscous, and meat dishes.  Dill - Fish dishes, sauces, and salads.  Fennel - Meat and vegetable dishes, breads, and cookies.  Garlic (do not use garlic salt) - [place] dishes, meat dishes, salad dressings, and sauces.  Marjoram - Soups, potato dishes, and meat dishes.  Oregano - Pizza and spaghetti sauce.  Parsley - Salads, soups, pasta, and meat dishes.  Rosemary - [place] dishes, salad dressings,  soups, and red meats.  Saffron - Fish dishes, pasta, and some poultry dishes.  Sage - Stuffings and sauces.  Tarragon - Fish and Whole Foodspoultry dishes.  Thyme - Stuffing, meat, and fish dishes. Seasonings  Lemon juice - Fish dishes, poultry dishes, vegetables, and salads.  Vinegar - Salad dressings, vegetables, and fish dishes. Spices  Cinnamon - Sweet dishes, such as cakes, cookies, and puddings.  Cloves - Gingerbread, puddings, and marinades for meats.  Curry - Vegetable dishes, fish and poultry dishes, and stir-fry dishes.  Ginger - Vegetables dishes, fish dishes, and stir-fry dishes.  Nutmeg - Pasta, vegetables, poultry, fish dishes, and custard. What are some low-sodium ingredients and foods?  Fresh or frozen fruits and vegetables with no sauce added.  Fresh or frozen whole meats, poultry, and fish with no sauce added.  Eggs.  Noodles, pasta, quinoa, rice.  Shredded or puffed wheat or puffed rice.  Regular or quick oats.  Milk, yogurt, hard cheeses, and low-sodium cheeses. Good cheese choices include Swiss, [employer], and [address]zzarella. Always check the label for the serving size and sodium content.  Unsalted butter or margarine.  Unsalted nuts.  Sherbet or ice cream (keep to  cup per serving).  Homemade pudding.  Sodium-free baking soda and baking powder. This is not a complete list of low-sodium ingredients and foods. Contact your dietitian for more options. Summary  Cooking with less salt is one way to reduce the amount of sodium that you get from food.  Buy sodium-free or low-sodium products.  Check the food label before using or buying packaged ingredients.  Use herbs, seasonings without salt, and spices as substitutes for salt in foods. This information is not intended to replace advice given to you by your health care provider. Make sure you discuss any questions you have with your health care provider. Document Released: 10/26/2005 Document  Revised: 11/03/2016 Document Reviewed: 11/03/2016 Elsevier Interactive Patient Education  2017 Else[employer] Inc. DASH Eating Plan DASH stands for "Dietary Approaches to Stop Hypertension." The DASH eating plan is a healthy eating plan that has been shown to reduce high blood pressure (hypertension). It may also reduce your risk for type 2 diabetes, heart disease, and stroke. The DASH eating plan may also help with weight loss. What are tips for following this plan? General guidelines  Avoid eating more than 2,300 mg (milligrams) of salt (sodium) a day. If you have hypertension, you may need to reduce your sodium intake to 1,500 mg a day.  Limit alcohol intake to no more than 1 drink a day for nonpregnant women and 2 drinks a day for men. One drink equals 12 oz of beer, 5 oz of wine, or 1 oz of hard liquor.  Work with your health care provider to maintain a healthy body weight or to lose weight. Ask what an ideal weight is for you.  Get at least 30 minutes of exercise that causes your heart to beat faster (aerobic exercise) most days of the week. Activities may include walking, swimming, or biking.  Work with your health care provider or diet and nutrition specialist (dietitian) to adjust your eating plan to your individual calorie needs. Reading food labels  Check food labels for the amount of sodium per serving. Choose foods with less than 5 percent of the Daily Value of sodium. Generally, foods with less than 300 mg of sodium per serving fit into this eating plan.  To find whole grains, look for the word "whole" as the first word in the ingredient list. Shopping  Buy products labeled as "low-sodium" or "no salt added."  Buy fresh foods. Avoid canned foods and premade or frozen meals. Cooking  Avoid adding salt when cooking. Use salt-free seasonings or herbs instead of table salt or sea salt. Check with your health care provider or pharmacist before using salt substitutes.  Do not fry  foods. Cook foods using healthy methods such as baking, boiling, grilling, and broiling instead.  Cook with heart-healthy oils, such as olive, canola, soybean, or sunflower oil. Meal planning   Eat a balanced diet that includes: ? 5 or more servings of fruits and vegetables each day. At each meal, try to fill half of your plate with fruits and vegetables. ? Up to 6-8 servings of whole grains each day. ? Less than 6 oz of lean meat, poultry, or fish each day. A 3-oz serving of meat is about the same size as a deck of cards. One egg equals 1 oz. ? 2 servings of low-fat dairy each day. ? A serving of nuts, seeds, or beans 5 times each week. ? Heart-healthy fats. Healthy fats called Omega-3 fatty acids are found in foods such as flaxseeds and coldwater fish, like sardines, salmon, and mackerel.  Limit how much you eat of the following: ? Canned or prepackaged foods. ? Food that is high in trans fat, such as fried foods. ? Food that is high in saturated fat, such as fatty meat. ? Sweets, desserts, sugary drinks, and other foods with added sugar. ? Full-fat dairy products.  Do not salt foods before eating.  Try to eat at least 2 vegetarian meals each week.  Eat more home-cooked food and less restaurant, buffet, and fast food.  When eating at a restaurant, ask that your food be prepared with less salt or no salt, if possible. What foods are recommended? The items listed may not be a complete list. Talk with your dietitian about what dietary choices are best for you. Grains Whole-grain or whole-wheat bread. Whole-grain or whole-wheat pasta. Brown rice. [place]. Bulgur. Whole-grain and low-sodium cereals. Pita bread. Low-fat, low-sodium crackers. Whole-wheat flour tortillas. Vegetables Fresh or frozen vegetables (raw, steamed, roasted, or grilled). Low-sodium or reduced-sodium tomato and vegetable juice. Low-sodium or reduced-sodium tomato sauce and tomato paste. Low-sodium or  reduced-sodium canned vegetables. Fruits All fresh, dried, or frozen fruit. Canned fruit in natural juice (without added sugar). Meat and other protein foods Skinless chicken or [place]. Ground chicken or [place]. Pork with fat trimmed off. Fish and seafood. Egg whites. Dried beans, peas, or lentils. Unsalted nuts, nut butters, and seeds. Unsalted canned beans. Lean cuts of beef with fat trimmed off. Low-sodium, lean deli meat. Dairy Low-fat (1%) or fat-free (skim) milk. Fat-free, low-fat, or reduced-fat cheeses. Nonfat, low-sodium ricotta or cottage cheese. Low-fat or nonfat yogurt. Low-fat, low-sodium cheese. Fats and oils Soft margarine without trans fats. Vegetable oil. Low-fat, reduced-fat, or light mayonnaise and salad dressings (reduced-sodium). Canola, safflower, olive, soybean, and sunflower oils. Avocado. Seasoning and other foods Herbs. Spices. Seasoning mixes without salt. Unsalted popcorn and pretzels. Fat-free sweets. What foods are not recommended? The items listed may not be a complete list. Talk with your dietitian about what dietary choices are best for you. Grains Baked goods made with fat, such as croissants, muffins, or some breads. Dry pasta or rice meal packs. Vegetables Creamed or fried vegetables. Vegetables in a cheese sauce. Regular canned vegetables (not low-sodium or reduced-sodium). Regular canned tomato sauce and paste (not low-sodium or reduced-sodium). Regular tomato and vegetable juice (not low-sodium or reduced-sodium). Angie Fava. Olives. Fruits Canned fruit in a light or heavy syrup. Fried fruit. Fruit in cream or butter sauce. Meat and other protein foods Fatty cuts of meat. Ribs. Fried meat. Berniece Salines. Sausage. Bologna and other processed lunch meats. Salami. Fatback. Hotdogs. Bratwurst. Salted nuts and seeds. Canned beans with added salt. Canned or smoked fish. Whole eggs or egg yolks. Chicken or [place] with skin. Dairy Whole or 2% milk, cream, and half-and-half.  Whole or full-fat cream cheese. Whole-fat or sweetened yogurt. Full-fat cheese. Nondairy creamers. Whipped toppings. Processed cheese and cheese spreads. Fats and oils Butter. Stick margarine. Lard. Shortening. Ghee. Bacon fat. Tropical oils, such as coconut, palm kernel, or palm oil. Seasoning and other foods Salted popcorn and pretzels. Onion salt, garlic salt, seasoned salt, table salt, and sea salt. Worcestershire sauce. Tartar sauce. Barbecue sauce. Teriyaki sauce. Soy sauce, including reduced-sodium. Steak sauce. Canned and packaged gravies. Fish sauce. Oyster sauce. Cocktail sauce. Horseradish that you find on the shelf. Ketchup. Mustard. Meat flavorings and tenderizers. Bouillon cubes. Hot sauce and Tabasco sauce. Premade or packaged marinades. Premade or packaged taco seasonings. Relishes. Regular salad dressings. Where to find more information:  National Heart, Lung, and Blood  Institute: PopSteam.is  American Heart Association: www.heart.org Summary  The DASH eating plan is a healthy eating plan that has been shown to reduce high blood pressure (hypertension). It may also reduce your risk for type 2 diabetes, heart disease, and stroke.  With the DASH eating plan, you should limit salt (sodium) intake to 2,300 mg a day. If you have hypertension, you may need to reduce your sodium intake to 1,500 mg a day.  When on the DASH eating plan, aim to eat more fresh fruits and vegetables, whole grains, lean proteins, low-fat dairy, and heart-healthy fats.  Work with your health care provider or diet and nutrition specialist (dietitian) to adjust your eating plan to your individual calorie needs. This information is not intended to replace advice given to you by your health care provider. Make sure you discuss any questions you have with your health care provider. Document Released: 10/15/2011 Document Revised: 10/19/2016 Document Reviewed: 10/19/2016 Elsevier Interactive Patient Education   2017 [employer] Inc. Hypertension Hypertension, commonly called high blood pressure, is when the force of blood pumping through the arteries is too strong. The arteries are the blood vessels that carry blood from the heart throughout the body. Hypertension forces the heart to work harder to pump blood and may cause arteries to become narrow or stiff. Having untreated or uncontrolled hypertension can cause heart attacks, strokes, kidney disease, and other problems. A blood pressure reading consists of a higher number over a lower number. Ideally, your blood pressure should be below 120/80. The first ("top") number is called the systolic pressure. It is a measure of the pressure in your arteries as your heart beats. The second ("bottom") number is called the diastolic pressure. It is a measure of the pressure in your arteries as the heart relaxes. What are the causes? The cause of this condition is not known. What increases the risk? Some risk factors for high blood pressure are under your control. Others are not. Factors you can change  Smoking.  Having type 2 diabetes mellitus, high cholesterol, or both.  Not getting enough exercise or physical activity.  Being overweight.  Having too much fat, sugar, calories, or salt (sodium) in your diet.  Drinking too much alcohol. Factors that are difficult or impossible to change  Having chronic kidney disease.  Having a family history of high blood pressure.  Age. Risk increases with age.  Race. You may be at higher risk if you are African-American.  Gender. Men are at higher risk than women before age 20. After age 36, women are at higher risk than men.  Having obstructive sleep apnea.  Stress. What are the signs or symptoms? Extremely high blood pressure (hypertensive crisis) may cause:  Headache.  Anxiety.  Shortness of breath.  Nosebleed.  Nausea and vomiting.  Severe chest pain.  Jerky movements you cannot control  (seizures).  How is this diagnosed? This condition is diagnosed by measuring your blood pressure while you are seated, with your arm resting on a surface. The cuff of the blood pressure monitor will be placed directly against the skin of your upper arm at the level of your heart. It should be measured at least twice using the same arm. Certain conditions can cause a difference in blood pressure between your right and left arms. Certain factors can cause blood pressure readings to be lower or higher than normal (elevated) for a short period of time:  When your blood pressure is higher when you are in a health care  provider's office than when you are at home, this is called white coat hypertension. Most people with this condition do not need medicines.  When your blood pressure is higher at home than when you are in a health care provider's office, this is called masked hypertension. Most people with this condition may need medicines to control blood pressure.  If you have a high blood pressure reading during one visit or you have normal blood pressure with other risk factors:  You may be asked to return on a different day to have your blood pressure checked again.  You may be asked to monitor your blood pressure at home for 1 week or longer.  If you are diagnosed with hypertension, you may have other blood or imaging tests to help your health care provider understand your overall risk for other conditions. How is this treated? This condition is treated by making healthy lifestyle changes, such as eating healthy foods, exercising more, and reducing your alcohol intake. Your health care provider may prescribe medicine if lifestyle changes are not enough to get your blood pressure under control, and if:  Your systolic blood pressure is above 130.  Your diastolic blood pressure is above 80.  Your personal target blood pressure may vary depending on your medical conditions, your age, and other  factors. Follow these instructions at home: Eating and drinking  Eat a diet that is high in fiber and potassium, and low in sodium, added sugar, and fat. An example eating plan is called the DASH (Dietary Approaches to Stop Hypertension) diet. To eat this way: ? Eat plenty of fresh fruits and vegetables. Try to fill half of your plate at each meal with fruits and vegetables. ? Eat whole grains, such as whole wheat pasta, brown rice, or whole grain bread. Fill about one quarter of your plate with whole grains. ? Eat or drink low-fat dairy products, such as skim milk or low-fat yogurt. ? Avoid fatty cuts of meat, processed or cured meats, and poultry with skin. Fill about one quarter of your plate with lean proteins, such as fish, chicken without skin, beans, eggs, and tofu. ? Avoid premade and processed foods. These tend to be higher in sodium, added sugar, and fat.  Reduce your daily sodium intake. Most people with hypertension should eat less than 1,500 mg of sodium a day.  Limit alcohol intake to no more than 1 drink a day for nonpregnant women and 2 drinks a day for men. One drink equals 12 oz of beer, 5 oz of wine, or 1 oz of hard liquor. Lifestyle  Work with your health care provider to maintain a healthy body weight or to lose weight. Ask what an ideal weight is for you.  Get at least 30 minutes of exercise that causes your heart to beat faster (aerobic exercise) most days of the week. Activities may include walking, swimming, or biking.  Include exercise to strengthen your muscles (resistance exercise), such as pilates or lifting weights, as part of your weekly exercise routine. Try to do these types of exercises for 30 minutes at least 3 days a week.  Do not use any products that contain nicotine or tobacco, such as cigarettes and e-cigarettes. If you need help quitting, ask your health care provider.  Monitor your blood pressure at home as told by your health care provider.  Keep  all follow-up visits as told by your health care provider. This is important. Medicines  Take over-the-counter and prescription medicines only as  told by your health care provider. Follow directions carefully. Blood pressure medicines must be taken as prescribed.  Do not skip doses of blood pressure medicine. Doing this puts you at risk for problems and can make the medicine less effective.  Ask your health care provider about side effects or reactions to medicines that you should watch for. Contact a health care provider if:  You think you are having a reaction to a medicine you are taking.  You have headaches that keep coming back (recurring).  You feel dizzy.  You have swelling in your ankles.  You have trouble with your vision. Get help right away if:  You develop a severe headache or confusion.  You have unusual weakness or numbness.  You feel faint.  You have severe pain in your chest or abdomen.  You vomit repeatedly.  You have trouble breathing. Summary  Hypertension is when the force of blood pumping through your arteries is too strong. If this condition is not controlled, it may put you at risk for serious complications.  Your personal target blood pressure may vary depending on your medical conditions, your age, and other factors. For most people, a normal blood pressure is less than 120/80.  Hypertension is treated with lifestyle changes, medicines, or a combination of both. Lifestyle changes include weight loss, eating a healthy, low-sodium diet, exercising more, and limiting alcohol. This information is not intended to replace advice given to you by your health care provider. Make sure you discuss any questions you have with your health care provider. Document Released: 10/26/2005 Document Revised: 09/23/2016 Document Reviewed: 09/23/2016 Elsevier Interactive Patient Education  2018 [employer]. Vitamin D Deficiency Vitamin D deficiency is when your body  does not have enough vitamin D. Vitamin D is important because:  It helps your body use other minerals that your body needs.  It helps keep your bones strong and healthy.  It may help to prevent some diseases.  It helps your heart and other muscles work well.  You can get vitamin D by:  Eating foods with vitamin D in them.  Drinking or eating milk or other foods that have had vitamin D added to them.  Taking a vitamin D supplement.  Being in the sun.  Not getting enough vitamin D can make your bones become soft. It can also cause other health problems. Follow these instructions at home:  Take medicines and supplements only as told by your doctor.  Eat foods that have vitamin D. These include: ? Dairy products, cereals, or juices with added vitamin D. Check the label for vitamin D. ? Fatty fish like salmon or trout. ? Eggs. ? Oysters.  Do not use tanning beds.  Stay at a healthy weight. Lose weight, if needed.  Keep all follow-up visits as told by your doctor. This is important. Contact a doctor if:  Your symptoms do not go away.  You feel sick to your stomach (nauseous).  Youthrow up (vomit).  You poop less often than usual or you have trouble pooping (constipation). This information is not intended to replace advice given to you by your health care provider. Make sure you discuss any questions you have with your health care provider. Document Released: 10/15/2011 Document Revised: 04/02/2016 Document Reviewed: 03/13/2015 Elsevier Interactive Patient Education  2018 [employer]. Tendinitis Tendinitis is swelling (inflammation) of a tendon. A tendon is cord of tissue that connects muscle to bone. Tendinitis can cause pain, tenderness, and swelling. It is usually treated with RICE  therapy. RICE stands for:  Rest.  Ice.  Compression. This means putting pressure on the affected area.  Elevation. This means raising the affected area above the level of your  heart.  Follow these instructions at home: If you have a splint or brace:  Wear the splint or brace as told by your doctor. Remove it only as told by your doctor.  Loosen the splint or brace if your fingers or toes tingle, become numb, or turn cold and blue.  Do not take baths, swim, or use a hot tub until your doctor approves. Ask your doctor if you can take showers. You may only be able to take sponge baths for bathing.  Do not let your splint or brace get wet if it is not waterproof. ? If your splint or brace is not waterproof, cover it with a watertight plastic bag when you take a bath or a shower.  Keep the splint or brace clean. Managing pain, stiffness, and swelling  If directed, apply ice to the affected area. ? Put ice in a plastic bag. ? Place a towel between your skin and the bag. ? Leave the ice on for 20 minutes, 2-3 times a day.  If directed, apply heat to the affected area as often as told by your doctor. Use the heat source that your doctor recommends. ? Place a towel between your skin and the heat source. ? Leave the heat on for 20-30 minutes. ? Take off the heat if your skin turns bright red. This is especially important if you are unable to feel pain, heat, or cold. You may have a greater risk of getting burned.  Move the fingers or toes of the affected arm or leg often, if this applies. This helps to prevent stiffness and to lessen swelling.  If directed, raise the affected area above the level of your heart while you are sitting or lying down. Driving  Do not drive or use heavy machinery while taking prescription pain medicine.  Ask your doctor when it is safe to drive if you have a splint or brace on any part of your arm or leg. Activity  Return to your normal activities as told by your doctor. Ask your doctor what activities are safe for you.  Rest the affected area as told by your doctor.  Avoid using the affected area while you have tendinitis.  Do  exercises (physical therapy) as told by your doctor. General instructions  If you have a splint, do not put pressure on any part of the splint until it is fully hardened. This may take several hours.  Wear an elastic bandage or pressure (compression) wrap only as told by your doctor.  Take over-the-counter and prescription medicines only as told by your doctor.  Keep all follow-up visits as told by your doctor. This is important. Contact a doctor if:  You do not get better.  You get new problems, such as numbness in your hands, and you do not know why. This information is not intended to replace advice given to you by your health care provider. Make sure you discuss any questions you have with your health care provider. Document Released: 02/05/2011 Document Revised: 06/25/2016 Document Reviewed: 07/29/2015 Elsevier Interactive Patient Education  [employer].

## 2017-07-01 NOTE — Progress Notes (Signed)
Subjective:     Patient ID: Natalie Vargas, female   DOB: 02-May-1966, 51 y.o.   MRN: [id number]  HPI  Patient is a 51 year old female who returns for follow up on blood pressure recheck. Denies any chest pain,leg swelling.  She denies any high readings at home. She inquires on how to take blood pressure as far as after exercise or rest.  She reports she is exercising more now and denies any shortness of breath or chest pain at rest or with exercise. She reports she is urinating normally.   She is taking 2,000 IU of Vitamin D 3 Daily and her Multivitamin with 400IU of Vitamin D 3 in it.      Current Outpatient Prescriptions:  .  Calcium Carbonate-Vitamin D 600-400 MG-UNIT tablet, Take 1 tablet by mouth daily. , Disp: , Rfl:  .  Cholecalciferol (VITAMIN D PO), Take 1,000 mg by mouth 2 (two) times daily., Disp: , Rfl:  .  hydrochlorothiazide (HYDRODIURIL) 25 MG tablet, Take 1 tablet (25 mg total) by mouth daily., Disp: 90 tablet, Rfl: 3 .  Omega-3 Fatty Acids (FISH OIL) 1000 MG CAPS, Take 1,000 mg by mouth daily., Disp: , Rfl:   Vitals:   07/01/17 0802  BP: 122/82  Pulse: 74  Temp: 98.3 F (36.8 C)  SpO2: 97%   Recheck Blood pressure by provider 124/76 Heart rate 76   Review of Systems  Constitutional: Negative.  Negative for activity change, appetite change, chills, diaphoresis, fatigue, fever and unexpected weight change.  HENT: Negative for congestion, dental problem, drooling, ear discharge, ear pain, facial swelling, hearing loss, mouth sores, nosebleeds, postnasal drip, rhinorrhea, sinus pain, sinus pressure, sneezing, sore throat, tinnitus, trouble swallowing and voice change.   Eyes: Negative for photophobia, pain, discharge, redness, itching and visual disturbance.  Respiratory: Negative for apnea, cough, choking, chest tightness, shortness of breath, wheezing and stridor.   Cardiovascular: Negative for chest pain, palpitations and leg swelling.  Gastrointestinal: Negative for  abdominal distention, abdominal pain, anal bleeding, blood in stool, constipation, diarrhea, nausea, rectal pain and vomiting.  Endocrine: Negative for cold intolerance, heat intolerance, polydipsia, polyphagia and polyuria.  Genitourinary: Negative for decreased urine volume, difficulty urinating, dyspareunia, dysuria, enuresis, flank pain, frequency, genital sores, hematuria, menstrual problem, pelvic pain, urgency, vaginal bleeding, vaginal discharge and vaginal pain.  Musculoskeletal: Negative for arthralgias, back pain, gait problem, joint swelling, myalgias, neck pain and neck stiffness.  Skin: Negative for color change, pallor, rash and wound.  Allergic/Immunologic: Negative for environmental allergies, food allergies and immunocompromised state.  Neurological: Negative for dizziness, tremors, seizures, syncope, facial asymmetry, speech difficulty, weakness, light-headedness, numbness and headaches.  Hematological: Negative for adenopathy. Does not bruise/bleed easily.  Psychiatric/Behavioral: Negative for agitation, behavioral problems, confusion, decreased concentration, dysphoric mood, hallucinations, self-injury, sleep disturbance and suicidal ideas. The patient is not nervous/anxious and is not hyperactive.        Objective:   Physical Exam  Constitutional: She is oriented to person, place, and time. She appears well-developed and well-nourished. No distress. She is not intubated.  HENT:  Head: Normocephalic and atraumatic.  Right Ear: Hearing, external ear and ear canal normal. No drainage or swelling. Tympanic membrane is not perforated and not erythematous. A middle ear effusion is present. No decreased hearing is noted.  Left Ear: Hearing, external ear and ear canal normal. No drainage or swelling. Tympanic membrane is not perforated and not erythematous. A middle ear effusion is present. No decreased hearing is noted.  Nose: No  mucosal edema or rhinorrhea.  Mouth/Throat: Uvula is  midline and oropharynx is clear and moist. Mucous membranes are not pale, not dry and not cyanotic. No oropharyngeal exudate.  Eyes: Pupils are equal, round, and reactive to light. Conjunctivae, EOM and lids are normal.  Neck: Trachea normal, normal range of motion and full passive range of motion without pain. Neck supple. Normal carotid pulses, no hepatojugular reflux and no JVD present. Carotid bruit is not present. No Brudzinski's sign and no Kernig's sign noted. No thyroid mass and no thyromegaly present.  Cardiovascular: Normal rate, regular rhythm, S1 normal, S2 normal and normal heart sounds.  Exam reveals no distant heart sounds and no friction rub.   Pulmonary/Chest: Effort normal and breath sounds normal. No apnea, no tachypnea and no bradypnea. She is not intubated.  Abdominal: Normal appearance.  Musculoskeletal: Normal range of motion.  Lymphadenopathy:       Head (right side): No submental, no submandibular, no tonsillar, no preauricular, no posterior auricular and no occipital adenopathy present.       Head (left side): No submental, no submandibular, no tonsillar, no preauricular, no posterior auricular and no occipital adenopathy present.    She has no cervical adenopathy.    She has no axillary adenopathy.  Neurological: She is alert and oriented to person, place, and time. She has normal strength. She displays normal reflexes. She displays a negative Romberg sign.  Patient moves on and off exam table without difficulty and gait is sure and steady in room. She makes eye contact and engages in conversation. Pleasant.   Skin: She is not diaphoretic.  Psychiatric: She has a normal mood and affect. Her speech is normal and behavior is normal. Judgment and thought content normal. Cognition and memory are normal.       Assessment:     1. Blood pressure check within JNC 8 Hypertensioin guidelines and patient is asymptomatic at this time.     Plan:  1. Continue hydrochlorothiazide  as below: recheck in office three months / October 2018. Do fasting labs before visit to recheck Vitamin D and kidney function.  Continue Vitamin D3 supplement and will recheck as above.  Current Outpatient Prescriptions:  .  Calcium Carbonate-Vitamin D 600-400 MG-UNIT tablet, Take 1 tablet by mouth daily. , Disp: , Rfl:  .  Cholecalciferol (VITAMIN D PO), Take 1,000 mg by mouth 2 (two) times daily., Disp: , Rfl:  .  hydrochlorothiazide (HYDRODIURIL) 25 MG tablet, Take 1 tablet (25 mg total) by mouth daily., Disp: 90 tablet, Rfl: 3 .  Omega-3 Fatty Acids (FISH OIL) 1000 MG CAPS, Take 1,000 mg by mouth daily., Disp: , Rfl:      Discussed patient needs to keep a log of blood pressure reading and bring to the office. Discussed current JNC 8 blood pressure guidelines and when to call if blood pressure elevated to make an appointment. Advised 911/  Emergency room for any chest pain, shortness of breath, neck pain or any unusual or new symptoms. Patient verbalized understanding and denies any questions at this time.   Future labs as follows:  Orders Placed This Encounter  Procedures  . CMP12+LP+TP+TSH+6AC+CBC/D/Plt    Due in October    Standing Status:   Future    Standing Expiration Date:   10/01/2017  . VITAMIN D 25 Hydroxy (Vit-D Deficiency, Fractures)    Due in October    Standing Status:   Future    Standing Expiration Date:   10/01/2017

## 2017-08-11 ENCOUNTER — Other Ambulatory Visit: Payer: Self-pay | Admitting: Obstetrics and Gynecology

## 2017-08-11 DIAGNOSIS — Z1231 Encounter for screening mammogram for malignant neoplasm of breast: Secondary | ICD-10-CM

## 2017-08-20 ENCOUNTER — Encounter (HOSPITAL_COMMUNITY): Payer: Self-pay

## 2017-08-20 ENCOUNTER — Ambulatory Visit
Admission: RE | Admit: 2017-08-20 | Discharge: 2017-08-20 | Disposition: A | Discharge status: Home / Self Care | Payer: BLUE CROSS/BLUE SHIELD | Source: Ambulatory Visit | Attending: Obstetrics and Gynecology | Admitting: Obstetrics and Gynecology

## 2017-08-20 DIAGNOSIS — Z1231 Encounter for screening mammogram for malignant neoplasm of breast: Secondary | ICD-10-CM | POA: Insufficient documentation

## 2017-08-24 ENCOUNTER — Other Ambulatory Visit: Payer: Self-pay | Admitting: Obstetrics and Gynecology

## 2017-08-24 DIAGNOSIS — N6489 Other specified disorders of breast: Secondary | ICD-10-CM

## 2017-08-24 DIAGNOSIS — R928 Other abnormal and inconclusive findings on diagnostic imaging of breast: Secondary | ICD-10-CM

## 2017-08-31 ENCOUNTER — Ambulatory Visit
Admission: RE | Admit: 2017-08-31 | Discharge: 2017-08-31 | Disposition: A | Discharge status: Home / Self Care | Payer: BLUE CROSS/BLUE SHIELD | Source: Ambulatory Visit | Attending: Obstetrics and Gynecology | Admitting: Obstetrics and Gynecology

## 2017-08-31 DIAGNOSIS — N6489 Other specified disorders of breast: Secondary | ICD-10-CM

## 2017-08-31 DIAGNOSIS — R928 Other abnormal and inconclusive findings on diagnostic imaging of breast: Secondary | ICD-10-CM

## 2017-09-03 ENCOUNTER — Other Ambulatory Visit: Payer: Self-pay

## 2017-09-03 DIAGNOSIS — Z0130 Encounter for examination of blood pressure without abnormal findings: Secondary | ICD-10-CM

## 2017-09-03 DIAGNOSIS — E559 Vitamin D deficiency, unspecified: Secondary | ICD-10-CM

## 2017-09-04 LAB — CMP12+LP+TP+TSH+6AC+CBC/D/PLT
ALBUMIN: 4.6 g/dL (ref 3.5–5.5)
ALT: 10 IU/L (ref 0–32)
AST: 17 IU/L (ref 0–40)
Albumin/Globulin Ratio: 1.8 (ref 1.2–2.2)
Alkaline Phosphatase: 80 IU/L (ref 39–117)
BUN/Creatinine Ratio: 17 (ref 9–23)
BUN: 16 mg/dL (ref 6–24)
Basophils Absolute: 0 10*3/uL (ref 0.0–0.2)
Basos: 0 %
Bilirubin Total: 0.3 mg/dL (ref 0.0–1.2)
CALCIUM: 9.3 mg/dL (ref 8.7–10.2)
CHOL/HDL RATIO: 3.3 ratio (ref 0.0–4.4)
CHOLESTEROL TOTAL: 198 mg/dL (ref 100–199)
Chloride: 101 mmol/L (ref 96–106)
Creatinine, Ser: 0.95 mg/dL (ref 0.57–1.00)
EOS (ABSOLUTE): 0.2 10*3/uL (ref 0.0–0.4)
Eos: 2 %
Estimated CHD Risk: <0.5 times avg. (ref 0.0–1.0)
FREE THYROXINE INDEX: 1.6 (ref 1.2–4.9)
GFR calc Af Amer: 80 mL/min/{1.73_m2} (ref >59)
GFR calc non Af Amer: 70 mL/min/{1.73_m2} (ref >59)
GGT: 11 IU/L (ref 0–60)
GLOBULIN, TOTAL: 2.5 g/dL (ref 1.5–4.5)
Glucose: 70 mg/dL (ref 65–99)
HDL: 60 mg/dL (ref >39)
Hematocrit: 38 % (ref 34.0–46.6)
Hemoglobin: 13.3 g/dL (ref 11.1–15.9)
IMMATURE GRANS (ABS): 0 10*3/uL (ref 0.0–0.1)
Immature Granulocytes: 0 %
Iron: 84 ug/dL (ref 27–159)
LDH: 220 IU/L (ref 119–226)
LDL Calculated: 122 mg/dL — ABNORMAL HIGH (ref 0–99)
LYMPHS ABS: 1.7 10*3/uL (ref 0.7–3.1)
LYMPHS: 27 %
MCH: 30.9 pg (ref 26.6–33.0)
MCHC: 35 g/dL (ref 31.5–35.7)
MCV: 88 fL (ref 79–97)
MONOS ABS: 0.7 10*3/uL (ref 0.1–0.9)
Monocytes: 12 %
NEUTROS ABS: 3.6 10*3/uL (ref 1.4–7.0)
Neutrophils: 59 %
PHOSPHORUS: 3.1 mg/dL (ref 2.5–4.5)
POTASSIUM: 4.7 mmol/L (ref 3.5–5.2)
Platelets: 335 10*3/uL (ref 150–379)
RBC: 4.3 x10E6/uL (ref 3.77–5.28)
RDW: 13.4 % (ref 12.3–15.4)
SODIUM: 141 mmol/L (ref 134–144)
T3 Uptake Ratio: 25 % (ref 24–39)
T4 TOTAL: 6.3 ug/dL (ref 4.5–12.0)
TRIGLYCERIDES: 82 mg/dL (ref 0–149)
TSH: 2.84 u[IU]/mL (ref 0.450–4.500)
Total Protein: 7.1 g/dL (ref 6.0–8.5)
Uric Acid: 5.4 mg/dL (ref 2.5–7.1)
VLDL Cholesterol Cal: 16 mg/dL (ref 5–40)
WBC: 6.2 10*3/uL (ref 3.4–10.8)

## 2017-09-04 LAB — VITAMIN D 25 HYDROXY (VIT D DEFICIENCY, FRACTURES): VIT D 25 HYDROXY: 44.8 ng/mL (ref 30.0–100.0)

## 2017-09-10 ENCOUNTER — Ambulatory Visit: Payer: Self-pay | Admitting: Adult Health

## 2017-09-10 VITALS — BP 110/78 | HR 71 | Temp 97.6°F | Resp 16 | Wt 173.0 lb

## 2017-09-10 DIAGNOSIS — Z719 Counseling, unspecified: Secondary | ICD-10-CM

## 2017-09-10 DIAGNOSIS — Z0000 Encounter for general adult medical examination without abnormal findings: Secondary | ICD-10-CM

## 2017-09-10 DIAGNOSIS — I10 Essential (primary) hypertension: Secondary | ICD-10-CM

## 2017-09-10 DIAGNOSIS — J018 Other acute sinusitis: Secondary | ICD-10-CM

## 2017-09-10 MED ORDER — AZITHROMYCIN 250 MG PO TABS: ORAL_TABLET | ORAL | 0 refills | Status: DC

## 2017-09-10 MED ORDER — HYDROCHLOROTHIAZIDE 25 MG PO TABS: 25.0000 mg | ORAL_TABLET | Freq: Every day | ORAL | 1 refills | Status: DC

## 2017-09-10 NOTE — Patient Instructions (Signed)
Sinusitis, Adult Sinusitis is soreness and inflammation of your sinuses. Sinuses are hollow spaces in the bones around your face. They are located:  Around your eyes.  In the middle of your forehead.  Behind your nose.  In your cheekbones.  Your sinuses and nasal passages are lined with a stringy fluid (mucus). Mucus normally drains out of your sinuses. When your nasal tissues get inflamed or swollen, the mucus can get trapped or blocked so air cannot flow through your sinuses. This lets bacteria, viruses, and funguses grow, and that leads to infection. Follow these instructions at home: Medicines  Take, use, or apply over-the-counter and prescription medicines only as told by your doctor. These may include nasal sprays.  If you were prescribed an antibiotic medicine, take it as told by your doctor. Do not stop taking the antibiotic even if you start to feel better. Hydrate and Humidify  Drink enough water to keep your pee (urine) clear or pale yellow.  Use a cool mist humidifier to keep the humidity level in your home above 50%.  Breathe in steam for 10-15 minutes, 3-4 times a day or as told by your doctor. You can do this in the bathroom while a hot shower is running.  Try not to spend time in cool or dry air. Rest  Rest as much as possible.  Sleep with your head raised (elevated).  Make sure to get enough sleep each night. General instructions  Put a warm, moist washcloth on your face 3-4 times a day or as told by your doctor. This will help with discomfort.  Wash your hands often with soap and water. If there is no soap and water, use hand sanitizer.  Do not smoke. Avoid being around people who are smoking (secondhand smoke).  Keep all follow-up visits as told by your doctor. This is important. Contact a doctor if:  You have a fever.  Your symptoms get worse.  Your symptoms do not get better within 10 days. Get help right away if:  You have a very bad  headache.  You cannot stop throwing up (vomiting).  You have pain or swelling around your face or eyes.  You have trouble seeing.  You feel confused.  Your neck is stiff.  You have trouble breathing. This information is not intended to replace advice given to you by your health care provider. Make sure you discuss any questions you have with your health care provider. Document Released: 04/13/2008 Document Revised: 06/21/2016 Document Reviewed: 08/21/2015 Elsevier Interactive Patient Education  2018 [employer]. Hypertension Hypertension, commonly called high blood pressure, is when the force of blood pumping through the arteries is too strong. The arteries are the blood vessels that carry blood from the heart throughout the body. Hypertension forces the heart to work harder to pump blood and may cause arteries to become narrow or stiff. Having untreated or uncontrolled hypertension can cause heart attacks, strokes, kidney disease, and other problems. A blood pressure reading consists of a higher number over a lower number. Ideally, your blood pressure should be below 120/80. The first ("top") number is called the systolic pressure. It is a measure of the pressure in your arteries as your heart beats. The second ("bottom") number is called the diastolic pressure. It is a measure of the pressure in your arteries as the heart relaxes. What are the causes? The cause of this condition is not known. What increases the risk? Some risk factors for high blood pressure are under your control.  Others are not. Factors you can change  Smoking.  Having type 2 diabetes mellitus, high cholesterol, or both.  Not getting enough exercise or physical activity.  Being overweight.  Having too much fat, sugar, calories, or salt (sodium) in your diet.  Drinking too much alcohol. Factors that are difficult or impossible to change  Having chronic kidney disease.  Having a family history of high  blood pressure.  Age. Risk increases with age.  Race. You may be at higher risk if you are African-American.  Gender. Men are at higher risk than women before age 11. After age 23, women are at higher risk than men.  Having obstructive sleep apnea.  Stress. What are the signs or symptoms? Extremely high blood pressure (hypertensive crisis) may cause:  Headache.  Anxiety.  Shortness of breath.  Nosebleed.  Nausea and vomiting.  Severe chest pain.  Jerky movements you cannot control (seizures).  How is this diagnosed? This condition is diagnosed by measuring your blood pressure while you are seated, with your arm resting on a surface. The cuff of the blood pressure monitor will be placed directly against the skin of your upper arm at the level of your heart. It should be measured at least twice using the same arm. Certain conditions can cause a difference in blood pressure between your right and left arms. Certain factors can cause blood pressure readings to be lower or higher than normal (elevated) for a short period of time:  When your blood pressure is higher when you are in a health care provider's office than when you are at home, this is called white coat hypertension. Most people with this condition do not need medicines.  When your blood pressure is higher at home than when you are in a health care provider's office, this is called masked hypertension. Most people with this condition may need medicines to control blood pressure.  If you have a high blood pressure reading during one visit or you have normal blood pressure with other risk factors:  You may be asked to return on a different day to have your blood pressure checked again.  You may be asked to monitor your blood pressure at home for 1 week or longer.  If you are diagnosed with hypertension, you may have other blood or imaging tests to help your health care provider understand your overall risk for other  conditions. How is this treated? This condition is treated by making healthy lifestyle changes, such as eating healthy foods, exercising more, and reducing your alcohol intake. Your health care provider may prescribe medicine if lifestyle changes are not enough to get your blood pressure under control, and if:  Your systolic blood pressure is above 130.  Your diastolic blood pressure is above 80.  Your personal target blood pressure may vary depending on your medical conditions, your age, and other factors. Follow these instructions at home: Eating and drinking  Eat a diet that is high in fiber and potassium, and low in sodium, added sugar, and fat. An example eating plan is called the DASH (Dietary Approaches to Stop Hypertension) diet. To eat this way: ? Eat plenty of fresh fruits and vegetables. Try to fill half of your plate at each meal with fruits and vegetables. ? Eat whole grains, such as whole wheat pasta, brown rice, or whole grain bread. Fill about one quarter of your plate with whole grains. ? Eat or drink low-fat dairy products, such as skim milk or low-fat yogurt. ?  Avoid fatty cuts of meat, processed or cured meats, and poultry with skin. Fill about one quarter of your plate with lean proteins, such as fish, chicken without skin, beans, eggs, and tofu. ? Avoid premade and processed foods. These tend to be higher in sodium, added sugar, and fat.  Reduce your daily sodium intake. Most people with hypertension should eat less than 1,500 mg of sodium a day.  Limit alcohol intake to no more than 1 drink a day for nonpregnant women and 2 drinks a day for men. One drink equals 12 oz of beer, 5 oz of wine, or 1 oz of hard liquor. Lifestyle  Work with your health care provider to maintain a healthy body weight or to lose weight. Ask what an ideal weight is for you.  Get at least 30 minutes of exercise that causes your heart to beat faster (aerobic exercise) most days of the week.  Activities may include walking, swimming, or biking.  Include exercise to strengthen your muscles (resistance exercise), such as pilates or lifting weights, as part of your weekly exercise routine. Try to do these types of exercises for 30 minutes at least 3 days a week.  Do not use any products that contain nicotine or tobacco, such as cigarettes and e-cigarettes. If you need help quitting, ask your health care provider.  Monitor your blood pressure at home as told by your health care provider.  Keep all follow-up visits as told by your health care provider. This is important. Medicines  Take over-the-counter and prescription medicines only as told by your health care provider. Follow directions carefully. Blood pressure medicines must be taken as prescribed.  Do not skip doses of blood pressure medicine. Doing this puts you at risk for problems and can make the medicine less effective.  Ask your health care provider about side effects or reactions to medicines that you should watch for. Contact a health care provider if:  You think you are having a reaction to a medicine you are taking.  You have headaches that keep coming back (recurring).  You feel dizzy.  You have swelling in your ankles.  You have trouble with your vision. Get help right away if:  You develop a severe headache or confusion.  You have unusual weakness or numbness.  You feel faint.  You have severe pain in your chest or abdomen.  You vomit repeatedly.  You have trouble breathing. Summary  Hypertension is when the force of blood pumping through your arteries is too strong. If this condition is not controlled, it may put you at risk for serious complications.  Your personal target blood pressure may vary depending on your medical conditions, your age, and other factors. For most people, a normal blood pressure is less than 120/80.  Hypertension is treated with lifestyle changes, medicines, or a  combination of both. Lifestyle changes include weight loss, eating a healthy, low-sodium diet, exercising more, and limiting alcohol. This information is not intended to replace advice given to you by your health care provider. Make sure you discuss any questions you have with your health care provider. Document Released: 10/26/2005 Document Revised: 09/23/2016 Document Reviewed: 09/23/2016 Elsevier Interactive Patient Education  2018 [employer] Cholesterol Cholesterol is a white, waxy, fat-like substance that is needed by the human body in small amounts. The liver makes all the cholesterol we need. Cholesterol is carried from the liver by the blood through the blood vessels. Deposits of cholesterol (plaques) may build up on blood vessel (  artery) walls. Plaques make the arteries narrower and stiffer. Cholesterol plaques increase the risk for heart attack and stroke. You cannot feel your cholesterol level even if it is very high. The only way to know that it is high is to have a blood test. Once you know your cholesterol levels, you should keep a record of the test results. Work with your health care provider to keep your levels in the desired range. What do the results mean?  Total cholesterol is a rough measure of all the cholesterol in your blood.  LDL (low-density lipoprotein) is the "bad" cholesterol. This is the type that causes plaque to build up on the artery walls. You want this level to be low.  HDL (high-density lipoprotein) is the "good" cholesterol because it cleans the arteries and carries the LDL away. You want this level to be high.  Triglycerides are fat that the body can either burn for energy or store. High levels are closely linked to heart disease. What are the desired levels of cholesterol?  Total cholesterol below 200.  LDL below 100 for people who are at risk, below 70 for people at very high risk.  HDL above 40 is good. A level of 60 or higher is considered to be  protective against heart disease.  Triglycerides below 150. How can I lower my cholesterol? Diet Follow your diet program as told by your health care provider.  Choose fish or white meat chicken and [place], roasted or baked. Limit fatty cuts of red meat, fried foods, and processed meats, such as sausage and lunch meats.  Eat lots of fresh fruits and vegetables.  Choose whole grains, beans, pasta, potatoes, and cereals.  Choose olive oil, corn oil, or canola oil, and use only small amounts.  Avoid butter, mayonnaise, shortening, or palm kernel oils.  Avoid foods with trans fats.  Drink skim or nonfat milk and eat low-fat or nonfat yogurt and cheeses. Avoid whole milk, cream, ice cream, egg yolks, and full-fat cheeses.  Healthier desserts include angel food cake, ginger snaps, animal crackers, hard candy, popsicles, and low-fat or nonfat frozen yogurt. Avoid pastries, cakes, pies, and cookies.  Exercise  Follow your exercise program as told by your health care provider. A regular program: ? Helps to decrease LDL and raise HDL. ? Helps with weight control.  Do things that increase your activity level, such as gardening, walking, and taking the stairs.  Ask your health care provider about ways that you can be more active in your daily life.  Medicine  Take over-the-counter and prescription medicines only as told by your health care provider. ? Medicine may be prescribed by your health care provider to help lower cholesterol and decrease the risk for heart disease. This is usually done if diet and exercise have failed to bring down cholesterol levels. ? If you have several risk factors, you may need medicine even if your levels are normal.  This information is not intended to replace advice given to you by your health care provider. Make sure you discuss any questions you have with your health care provider. Document Released: 07/21/2001 Document Revised: 05/23/2016 Document  Reviewed: 04/25/2016 Elsevier Interactive Patient Education  2017 [employer].

## 2017-09-10 NOTE — Progress Notes (Signed)
Subjective:    Patient ID: Natalie Vargas, female    DOB: January 03, 1966, 51 y.o.   MRN: [id number]  HPI  Patient is a 51 year old female in no acute distress, for blood pressure recheck and review of labs.   She also reports she has had nasal congestion and post nasal drip with facial fullness x 1 month.  She denies taking any over the counter medications.   LDL decreased one point to 123 .Vitamin D increasing.  Blood pressure checks at home reported to be around the same as here in office today.   Body mass index is 28.79 kg/m. Filed Weights   09/10/17 0758  Weight: 173 lb (78.5 kg)   Patient is doing aerobic exercise three to four  times a week, has decreased red meat.   She denies any fever,  rash, chest pain, shortness of breath, nausea, vomiting, edema or diarrhea.   She has seen GYN for urinary stress incontinence and was sent to [facility] to see specialist. She is scheduled for bladder sling December.    She has had her GYN exam and mammogram and will follow up accordingly per instructions by those providers.   She has no other concerns at this time.   Blood pressure 110/78, pulse 71, temperature 97.6 F (36.4 C), resp. rate 16, weight 173 lb (78.5 kg), last menstrual period 08/11/2017, SpO2 99 %.     Current Outpatient Prescriptions:  .  Calcium Carbonate-Vitamin D 600-400 MG-UNIT tablet, Take 1 tablet by mouth daily. , Disp: , Rfl:  .  Cholecalciferol (VITAMIN D PO), Take 1,000 mg by mouth 2 (two) times daily., Disp: , Rfl:  .  hydrochlorothiazide (HYDRODIURIL) 25 MG tablet, Take 1 tablet (25 mg total) by mouth daily., Disp: 90 tablet, Rfl: 3 .  Omega-3 Fatty Acids (FISH OIL) 1000 MG CAPS, Take 1,000 mg by mouth daily., Disp: , Rfl:  Review of Systems  Constitutional: Negative.   HENT: Positive for congestion, postnasal drip, rhinorrhea, sinus pressure (bilateral maxillary ) and sneezing. Negative for dental problem, drooling, ear discharge, ear pain, facial swelling,  hearing loss, mouth sores, nosebleeds, sinus pain, sore throat, tinnitus, trouble swallowing and voice change.   Eyes: Negative.   Respiratory: Negative for apnea, cough, choking, chest tightness, shortness of breath, wheezing and stridor.   Cardiovascular: Negative for chest pain, palpitations and leg swelling.  Gastrointestinal: Negative.   Endocrine: Negative.   Genitourinary: Negative.        Stress incontinence with exercise reported she is seeing specialist at [facility] that her GYN referred her to and has Bladder sling surgery scheduled for December 2018 per her report.  Musculoskeletal: Negative.   Skin: Negative.   Neurological: Negative.   Hematological: Negative.   Psychiatric/Behavioral: Negative.        Objective:   Physical Exam  Constitutional: She is oriented to person, place, and time. She appears well-developed and well-nourished. No distress.  HENT:  Head: Normocephalic and atraumatic.  Eyes: Pupils are equal, round, and reactive to light. Conjunctivae and EOM are normal.  Neck: Trachea normal, normal range of motion, full passive range of motion without pain and phonation normal. Neck supple. Normal carotid pulses, no hepatojugular reflux and no JVD present. Carotid bruit is not present. No tracheal deviation present.  Cardiovascular: Normal rate, regular rhythm, S1 normal, S2 normal, normal heart sounds and intact distal pulses.  Exam reveals no gallop and no friction rub.   No murmur heard. Pulmonary/Chest: Effort normal and breath sounds  normal. No accessory muscle usage or stridor. No respiratory distress. She has no wheezes. She has no rales. She exhibits no tenderness.  Abdominal: Soft. Normal aorta and bowel sounds are normal.  Musculoskeletal: Normal range of motion. She exhibits no edema.  Patient moves on and off of exam table and in room without difficulty. Gait is normal in hall and in room. Patient is oriented to person place time and situation. Patient answers  questions appropriately and engages in conversation.   Lymphadenopathy:    She has no cervical adenopathy.  Neurological: She is alert and oriented to person, place, and time. She has normal strength and normal reflexes. She displays normal reflexes. No cranial nerve deficit or sensory deficit. She exhibits normal muscle tone. She displays a negative Romberg sign. Coordination and gait normal. GCS eye subscore is 4. GCS verbal subscore is 5. GCS motor subscore is 6.  Skin: Skin is warm, dry and intact. No rash noted. She is not diaphoretic. No cyanosis or erythema. No pallor. Nails show no clubbing.          Assessment & Plan:  Hypertension, unspecified type  Acute non-recurrent sinusitis of other sinus  Health education   Meds ordered this encounter  Medications  . hydrochlorothiazide (HYDRODIURIL) 25 MG tablet    Sig: Take 1 tablet (25 mg total) by mouth daily.    Dispense:  90 tablet    Refill:  1  . azithromycin (ZITHROMAX) 250 MG tablet    Sig: Take 2 tablets ( 500 mg ) on day 1 and then take 250 mg one tablet on days 2, 3, 4, 5    Dispense:  6 tablet    Refill:  0   Return for office visit in 6 months for blood pressure check and follow up visit around May 2019 and sooner if needed.  If readings are high or low  per parameters given based off JNC 8 guidelines( hypertension)  or any new symptoms develop you need to call the office for an appointment and if after hours seek care in urgent care or emergency room. Call 911 with any emergent symptoms.  Will do labs one week prior.  I have already ordered this as a future order in the computer.  Orders Placed This Encounter  Procedures  . CMP12+LP+TP+TSH+6AC+CBC/D/Plt    Patient should be fasting.    Standing Status:   Future    Standing Expiration Date:   04/27/2018    Order Specific Question:   Has the patient fasted?    Answer:   Yes   Annual physical due after 05/19/18.   If sinusitis fails to improve :   Return to  clinic at any time  if any new symptoms change, worsen or do not improve. Symptoms should improve  within 72 hours and if not improving you should call for an appointment at the clinic is closed or be seen in urgent care/ED if clinic is closed. Follow up with your PCP.  AVS given to patient and reviewed.  Patient verbalized above understanding of all instructions and denies any questions currently.

## 2018-05-30 ENCOUNTER — Encounter: Payer: Self-pay | Admitting: Adult Health

## 2018-05-30 DIAGNOSIS — I10 Essential (primary) hypertension: Secondary | ICD-10-CM

## 2018-05-30 MED ORDER — HYDROCHLOROTHIAZIDE 25 MG PO TABS: 25.0000 mg | ORAL_TABLET | Freq: Every day | ORAL | 1 refills | Status: DC

## 2018-05-31 ENCOUNTER — Other Ambulatory Visit: Payer: Self-pay

## 2018-05-31 DIAGNOSIS — I10 Essential (primary) hypertension: Secondary | ICD-10-CM

## 2018-06-01 ENCOUNTER — Ambulatory Visit: Payer: Self-pay | Admitting: Adult Health

## 2018-06-01 LAB — CMP12+LP+TP+TSH+6AC+CBC/D/PLT
A/G RATIO: 1.8 (ref 1.2–2.2)
ALT: 6 IU/L (ref 0–32)
AST: 11 IU/L (ref 0–40)
Albumin: 4.1 g/dL (ref 3.5–5.5)
Alkaline Phosphatase: 69 IU/L (ref 39–117)
BILIRUBIN TOTAL: 0.4 mg/dL (ref 0.0–1.2)
BUN/Creatinine Ratio: 15 (ref 9–23)
BUN: 14 mg/dL (ref 6–24)
Basophils Absolute: 0 10*3/uL (ref 0.0–0.2)
Basos: 1 %
CHOL/HDL RATIO: 3.6 ratio (ref 0.0–4.4)
CREATININE: 0.95 mg/dL (ref 0.57–1.00)
Calcium: 9.2 mg/dL (ref 8.7–10.2)
Chloride: 102 mmol/L (ref 96–106)
Cholesterol, Total: 180 mg/dL (ref 100–199)
EOS (ABSOLUTE): 0.2 10*3/uL (ref 0.0–0.4)
ESTIMATED CHD RISK: 0.6 times avg. (ref 0.0–1.0)
Eos: 3 %
Free Thyroxine Index: 1.7 (ref 1.2–4.9)
GFR calc Af Amer: 80 mL/min/{1.73_m2} (ref >59)
GFR, EST NON AFRICAN AMERICAN: 70 mL/min/{1.73_m2} (ref >59)
GGT: 7 IU/L (ref 0–60)
GLOBULIN, TOTAL: 2.3 g/dL (ref 1.5–4.5)
Glucose: 95 mg/dL (ref 65–99)
HDL: 50 mg/dL (ref >39)
Hematocrit: 37.2 % (ref 34.0–46.6)
Hemoglobin: 12.5 g/dL (ref 11.1–15.9)
IRON: 103 ug/dL (ref 27–159)
Immature Grans (Abs): 0 10*3/uL (ref 0.0–0.1)
Immature Granulocytes: 0 %
LDH: 157 IU/L (ref 119–226)
LDL Calculated: 114 mg/dL — ABNORMAL HIGH (ref 0–99)
Lymphocytes Absolute: 1.4 10*3/uL (ref 0.7–3.1)
Lymphs: 25 %
MCH: 30.9 pg (ref 26.6–33.0)
MCHC: 33.6 g/dL (ref 31.5–35.7)
MCV: 92 fL (ref 79–97)
MONOS ABS: 0.6 10*3/uL (ref 0.1–0.9)
Monocytes: 11 %
NEUTROS ABS: 3.5 10*3/uL (ref 1.4–7.0)
Neutrophils: 60 %
POTASSIUM: 4 mmol/L (ref 3.5–5.2)
Phosphorus: 3.6 mg/dL (ref 2.5–4.5)
Platelets: 299 10*3/uL (ref 150–450)
RBC: 4.04 x10E6/uL (ref 3.77–5.28)
RDW: 13.8 % (ref 12.3–15.4)
SODIUM: 139 mmol/L (ref 134–144)
T3 Uptake Ratio: 28 % (ref 24–39)
T4, Total: 6.2 ug/dL (ref 4.5–12.0)
TRIGLYCERIDES: 82 mg/dL (ref 0–149)
TSH: 3.32 u[IU]/mL (ref 0.450–4.500)
Total Protein: 6.4 g/dL (ref 6.0–8.5)
Uric Acid: 4.3 mg/dL (ref 2.5–7.1)
VLDL Cholesterol Cal: 16 mg/dL (ref 5–40)
WBC: 5.8 10*3/uL (ref 3.4–10.8)

## 2018-06-01 LAB — HEMOGLOBIN A1C
ESTIMATED AVERAGE GLUCOSE: 117 mg/dL
Hgb A1c MFr Bld: 5.7 % — ABNORMAL HIGH (ref 4.8–5.6)

## 2018-06-01 LAB — VITAMIN D 25 HYDROXY (VIT D DEFICIENCY, FRACTURES): VIT D 25 HYDROXY: 62 ng/mL (ref 30.0–100.0)

## 2018-06-02 ENCOUNTER — Encounter: Payer: Self-pay | Admitting: Adult Health

## 2018-06-02 ENCOUNTER — Ambulatory Visit: Payer: Self-pay | Admitting: Adult Health

## 2018-06-02 VITALS — BP 115/82 | HR 71 | Temp 98.3°F | Resp 16 | Wt 144.6 lb

## 2018-06-02 DIAGNOSIS — Z0000 Encounter for general adult medical examination without abnormal findings: Secondary | ICD-10-CM

## 2018-06-02 DIAGNOSIS — I10 Essential (primary) hypertension: Secondary | ICD-10-CM

## 2018-06-02 DIAGNOSIS — R7303 Prediabetes: Secondary | ICD-10-CM

## 2018-06-02 MED ORDER — HYDROCHLOROTHIAZIDE 25 MG PO TABS: 25.0000 mg | ORAL_TABLET | Freq: Every day | ORAL | 1 refills | Status: DC

## 2018-06-02 NOTE — Progress Notes (Addendum)
Subjective:     Patient ID: Natalie Vargas, female   DOB: [birth year], 52 y.o.   MRN: [id number]  HPI   Blood pressure 115/82, pulse 71, temperature 98.3 F (36.8 C), temperature source Tympanic, resp. rate 16, weight 144 lb 9.6 oz (65.6 kg), SpO2 99 %.  Body mass index is 24.06 kg/m.    Patient  is a a 52 year old female in no acute distress who comes to the office for follow up and blood pressure recheck.  She reports she has been feeling well and has started weight watchers for the past " few months" and has had some intentional weight loss. She reports she is " feeling    Medication refill for hydrochlorothiazide, she has been taking for " years" without any side effects. She does have a mild rash allergy though she denies having any side effects while on hydrochlorothiazide.   Denies any rectal bleeding. She denies going  for colonoscopy last year as recommended.   She is due for repeat gynecology exam and mammogram after August 2019 and will call and schedule with her gynecologist for these.   Patient  denies any fever, body aches,chills, rash, chest pain, shortness of breath, nausea, vomiting, or diarrhea.   She sees eye doctor yearly.     Current Outpatient Medications:  .  Calcium Carbonate-Vitamin D 600-400 MG-UNIT tablet, Take 1 tablet by mouth daily. , Disp: , Rfl:  .  Cholecalciferol (VITAMIN D PO), Take 1,000 mg by mouth 2 (two) times daily., Disp: , Rfl:  .  hydrochlorothiazide (HYDRODIURIL) 25 MG tablet, Take 1 tablet (25 mg total) by mouth daily., Disp: 30 tablet, Rfl: 1 .  Omega-3 Fatty Acids (FISH OIL) 1000 MG CAPS, Take 1,000 mg by mouth daily., Disp: , Rfl:    Review of Systems  Constitutional: Negative.   HENT: Negative.   Eyes: Negative.   Respiratory: Negative.   Cardiovascular: Negative.   Gastrointestinal: Negative.   Endocrine: Negative.   Genitourinary: Negative.   Musculoskeletal: Negative.   Skin: Negative.   Allergic/Immunologic: Negative.    Neurological: Negative.   Hematological: Negative.   Psychiatric/Behavioral: Negative.        Objective:    Physical Exam  Constitutional: She is oriented to person, place, and time and well-developed, well-nourished, and in no distress. No distress.  Patient is alert and oriented and responsive to questions Engages in eye contact with provider. Speaks in full sentences without any pauses without any shortness of breath or distress.    Patient moves on and off of exam table and in room without difficulty. Gait is normal in hall and in room. Patient is oriented to person place time and situation. Patient answers questions appropriately and engages in conversation.   HENT:  Head: Normocephalic and atraumatic.  Right Ear: External ear normal.  Left Ear: External ear normal.  Nose: Nose normal.  Mouth/Throat: Oropharynx is clear and moist. No oropharyngeal exudate.  Eyes: Pupils are equal, round, and reactive to light. Conjunctivae and EOM are normal. Right eye exhibits no discharge. Left eye exhibits no discharge. No scleral icterus.  Neck: Normal range of motion. Neck supple. No JVD present. No tracheal deviation present. No thyromegaly present.  Cardiovascular: Normal rate, regular rhythm, normal heart sounds and intact distal pulses. Exam reveals no gallop and no friction rub.  No murmur heard. Pulmonary/Chest: Effort normal and breath sounds normal. No stridor. No respiratory distress. She has no wheezes. She has no rales. She exhibits no tenderness.  Abdominal: Soft. Normal appearance, normal aorta and bowel sounds are normal. She exhibits no shifting dullness, no distension, no pulsatile liver, no fluid wave, no abdominal bruit, no ascites, no pulsatile midline mass and no mass. There is no hepatosplenomegaly, splenomegaly or hepatomegaly. There is no tenderness. There is no rebound, no guarding and no CVA tenderness.  Genitourinary:  Genitourinary Comments: No gynecology exams done  in this office currently and no equipment available. Patient is aware she will have to see gynecology if needed for any pelvic/vaginal exam and will follow up with gynecology/obgyn as needed. Yearly gynecology pelvic exam recommended. Patient verbalized understanding of instructions and denies any further questions at this time.    Musculoskeletal: Normal range of motion. She exhibits no edema, tenderness or deformity.  Lymphadenopathy:       Head (right side): No submental, no submandibular, no tonsillar, no preauricular, no posterior auricular and no occipital adenopathy present.       Head (left side): No submental, no submandibular, no tonsillar, no preauricular, no posterior auricular and no occipital adenopathy present.    She has no cervical adenopathy.  Neurological: She is alert and oriented to person, place, and time. She has normal motor skills, normal sensation, normal strength and normal reflexes. She displays normal reflexes. No cranial nerve deficit. She exhibits normal muscle tone. Gait normal. Coordination normal. GCS score is 15.  Skin: Skin is warm, dry and intact. No rash noted. She is not diaphoretic. No erythema. No pallor. Nails show no clubbing.  Psychiatric: Mood, memory, affect and judgment normal.  Vitals reviewed.     Assessment:     Normal physical examination, routine      Plan:     Mildly elevated LDL - imp[roved from last LDL with diet control- reviewed all labs with patient.  Labs reviewed and education on nutrition and after visit summary reviewed in office.  Declines urinalysis dipstick at today's visit as she reprts she  will have done at gynecology  for routine screening in august per patient.  Refer for colonoscopy she will call if she has not heard from Dr. Servando Snare office within two weeks from today and this office will follow up.   Recommend yearly dermatology skin checks.   She will schedule gynecology and her gynecologist will order mammogram for  after August 2019. She has breast exams done at gynecology as well and declines at this office. 6 month below sent of medication as below:  Meds ordered this encounter  Medications  . hydrochlorothiazide (HYDRODIURIL) 25 MG tablet    Sig: Take 1 tablet (25 mg total) by mouth daily.    Dispense:  90 tablet    Refill:  1   Orders Placed This Encounter  Procedures  . Ambulatory referral to Gastroenterology    Referral Priority:   Routine    Referral Type:   Consultation    Referral Reason:   Specialty Services Required    Referred to Provider:   Midge Minium, MD    Number of Visits Requested:   1   Return to clinic in 6 months for recheck labs and follow up appointment the week after to recheck Hemoglobin A1C, glucose, vitamin D, and kidney function.   Orders Placed This Encounter  Procedures  . CMP12+LP+TP+TSH+6AC+CBC/D/Plt    Standing Status:   Future    Standing Expiration Date:   06/03/2019  . VITAMIN D 25 Hydroxy (Vit-D Deficiency, Fractures)    Standing Status:   Future    Standing Expiration  Date:   06/03/2019  . HgB A1c    Standing Status:   Future    Standing Expiration Date:   06/03/2019  . Ambulatory referral to Gastroenterology    Referral Priority:   Routine    Referral Type:   Consultation    Referral Reason:   Specialty Services Required    Referred to Provider:   Midge MiniumWohl, Darren, MD    Number of Visits Requested:   1      Advised patient call the office or your primary care doctor for an appointment if no improvement within 72 hours or if any symptoms change or worsen at any time  Advised ER or urgent Care if after hours or on weekend. Call 911 for emergency symptoms at any time.Patinet verbalized understanding of all instructions given/reviewed and treatment plan and has no further questions or concerns at this time.    Patient verbalized understanding of all instructions given and denies any further questions at this time.

## 2018-06-02 NOTE — Patient Instructions (Addendum)
Health Maintenance, Female Adopting a healthy lifestyle and getting preventive care can go a long way to promote health and wellness. Talk with your health care provider about what schedule of regular examinations is right for you. This is a good chance for you to check in with your provider about disease prevention and staying healthy. In between checkups, there are plenty of things you can do on your own. Experts have done a lot of research about which lifestyle changes and preventive measures are most likely to keep you healthy. Ask your health care provider for more information. Weight and diet Eat a healthy diet  Be sure to include plenty of vegetables, fruits, low-fat dairy products, and lean protein.  Do not eat a lot of foods high in solid fats, added sugars, or salt.  Get regular exercise. This is one of the most important things you can do for your health. ? Most adults should exercise for at least 150 minutes each week. The exercise should increase your heart rate and make you sweat (moderate-intensity exercise). ? Most adults should also do strengthening exercises at least twice a week. This is in addition to the moderate-intensity exercise.  Maintain a healthy weight  Body mass index (BMI) is a measurement that can be used to identify possible weight problems. It estimates body fat based on height and weight. Your health care provider can help determine your BMI and help you achieve or maintain a healthy weight.  For females 20 years of age and older: ? A BMI below 18.5 is considered underweight. ? A BMI of 18.5 to 24.9 is normal. ? A BMI of 25 to 29.9 is considered overweight. ? A BMI of 30 and above is considered obese.  Watch levels of cholesterol and blood lipids  You should start having your blood tested for lipids and cholesterol at 52 years of age, then have this test every 5 years.  You may need to have your cholesterol levels checked more often if: ? Your lipid or  cholesterol levels are high. ? You are older than 52 years of age. ? You are at high risk for heart disease.  Cancer screening Lung Cancer  Lung cancer screening is recommended for adults 55-80 years old who are at high risk for lung cancer because of a history of smoking.  A yearly low-dose CT scan of the lungs is recommended for people who: ? Currently smoke. ? Have quit within the past 15 years. ? Have at least a 30-pack-year history of smoking. A pack year is smoking an average of one pack of cigarettes a day for 1 year.  Yearly screening should continue until it has been 15 years since you quit.  Yearly screening should stop if you develop a health problem that would prevent you from having lung cancer treatment.  Breast Cancer  Practice breast self-awareness. This means understanding how your breasts normally appear and feel.  It also means doing regular breast self-exams. Let your health care provider know about any changes, no matter how small.  If you are in your 20s or 30s, you should have a clinical breast exam (CBE) by a health care provider every 1-3 years as part of a regular health exam.  If you are 40 or older, have a CBE every year. Also consider having a breast X-ray (mammogram) every year.  If you have a family history of breast cancer, talk to your health care provider about genetic screening.  If you are at high risk   for breast cancer, talk to your health care provider about having an MRI and a mammogram every year.  Breast cancer gene (BRCA) assessment is recommended for women who have family members with BRCA-related cancers. BRCA-related cancers include: ? Breast. ? Ovarian. ? Tubal. ? Peritoneal cancers.  Results of the assessment will determine the need for genetic counseling and BRCA1 and BRCA2 testing.  Cervical Cancer Your health care provider may recommend that you be screened regularly for cancer of the pelvic organs (ovaries, uterus, and  vagina). This screening involves a pelvic examination, including checking for microscopic changes to the surface of your cervix (Pap test). You may be encouraged to have this screening done every 3 years, beginning at age 22.  For women ages 56-65, health care providers may recommend pelvic exams and Pap testing every 3 years, or they may recommend the Pap and pelvic exam, combined with testing for human papilloma virus (HPV), every 5 years. Some types of HPV increase your risk of cervical cancer. Testing for HPV may also be done on women of any age with unclear Pap test results.  Other health care providers may not recommend any screening for nonpregnant women who are considered low risk for pelvic cancer and who do not have symptoms. Ask your health care provider if a screening pelvic exam is right for you.  If you have had past treatment for cervical cancer or a condition that could lead to cancer, you need Pap tests and screening for cancer for at least 20 years after your treatment. If Pap tests have been discontinued, your risk factors (such as having a new sexual partner) need to be reassessed to determine if screening should resume. Some women have medical problems that increase the chance of getting cervical cancer. In these cases, your health care provider may recommend more frequent screening and Pap tests.  Colorectal Cancer  This type of cancer can be detected and often prevented.  Routine colorectal cancer screening usually begins at 52 years of age and continues through 52 years of age.  Your health care provider may recommend screening at an earlier age if you have risk factors for colon cancer.  Your health care provider may also recommend using home test kits to check for hidden blood in the stool.  A small camera at the end of a tube can be used to examine your colon directly (sigmoidoscopy or colonoscopy). This is done to check for the earliest forms of colorectal  cancer.  Routine screening usually begins at age 33.  Direct examination of the colon should be repeated every 5-10 years through 52 years of age. However, you may need to be screened more often if early forms of precancerous polyps or small growths are found.  Skin Cancer  Check your skin from head to toe regularly.  Tell your health care provider about any new moles or changes in moles, especially if there is a change in a mole's shape or color.  Also tell your health care provider if you have a mole that is larger than the size of a pencil eraser.  Always use sunscreen. Apply sunscreen liberally and repeatedly throughout the day.  Protect yourself by wearing long sleeves, pants, a wide-brimmed hat, and sunglasses whenever you are outside.  Heart disease, diabetes, and high blood pressure  High blood pressure causes heart disease and increases the risk of stroke. High blood pressure is more likely to develop in: ? People who have blood pressure in the high end of  the normal range (130-139/85-89 mm Hg). ? People who are overweight or obese. ? People who are African American.  If you are 21-29 years of age, have your blood pressure checked every 3-5 years. If you are 3 years of age or older, have your blood pressure checked every year. You should have your blood pressure measured twice-once when you are at a hospital or clinic, and once when you are not at a hospital or clinic. Record the average of the two measurements. To check your blood pressure when you are not at a hospital or clinic, you can use: ? An automated blood pressure machine at a pharmacy. ? A home blood pressure monitor.  If you are between 17 years and 37 years old, ask your health care provider if you should take aspirin to prevent strokes.  Have regular diabetes screenings. This involves taking a blood sample to check your fasting blood sugar level. ? If you are at a normal weight and have a low risk for diabetes,  have this test once every three years after 52 years of age. ? If you are overweight and have a high risk for diabetes, consider being tested at a younger age or more often. Preventing infection Hepatitis B  If you have a higher risk for hepatitis B, you should be screened for this virus. You are considered at high risk for hepatitis B if: ? You were born in a country where hepatitis B is common. Ask your health care provider which countries are considered high risk. ? Your parents were born in a high-risk country, and you have not been immunized against hepatitis B (hepatitis B vaccine). ? You have HIV or AIDS. ? You use needles to inject street drugs. ? You live with someone who has hepatitis B. ? You have had sex with someone who has hepatitis B. ? You get hemodialysis treatment. ? You take certain medicines for conditions, including cancer, organ transplantation, and autoimmune conditions.  Hepatitis C  Blood testing is recommended for: ? Everyone born from [birth year] through 1965. ? Anyone with known risk factors for hepatitis C.  Sexually transmitted infections (STIs)  You should be screened for sexually transmitted infections (STIs) including gonorrhea and chlamydia if: ? You are sexually active and are younger than 52 years of age. ? You are older than 52 years of age and your health care provider tells you that you are at risk for this type of infection. ? Your sexual activity has changed since you were last screened and you are at an increased risk for chlamydia or gonorrhea. Ask your health care provider if you are at risk.  If you do not have HIV, but are at risk, it may be recommended that you take a prescription medicine daily to prevent HIV infection. This is called pre-exposure prophylaxis (PrEP). You are considered at risk if: ? You are sexually active and do not regularly use condoms or know the HIV status of your partner(s). ? You take drugs by injection. ? You are  sexually active with a partner who has HIV.  Talk with your health care provider about whether you are at high risk of being infected with HIV. If you choose to begin PrEP, you should first be tested for HIV. You should then be tested every 3 months for as long as you are taking PrEP. Pregnancy  If you are premenopausal and you may become pregnant, ask your health care provider about preconception counseling.  If you may become  pregnant, take 400 to 800 micrograms (mcg) of folic acid every day.  If you want to prevent pregnancy, talk to your health care provider about birth control (contraception). Osteoporosis and menopause  Osteoporosis is a disease in which the bones lose minerals and strength with aging. This can result in serious bone fractures. Your risk for osteoporosis can be identified using a bone density scan.  If you are 62 years of age or older, or if you are at risk for osteoporosis and fractures, ask your health care provider if you should be screened.  Ask your health care provider whether you should take a calcium or vitamin D supplement to lower your risk for osteoporosis.  Menopause may have certain physical symptoms and risks.  Hormone replacement therapy may reduce some of these symptoms and risks. Talk to your health care provider about whether hormone replacement therapy is right for you. Follow these instructions at home:  Schedule regular health, dental, and eye exams.  Stay current with your immunizations.  Do not use any tobacco products including cigarettes, chewing tobacco, or electronic cigarettes.  If you are pregnant, do not drink alcohol.  If you are breastfeeding, limit how much and how often you drink alcohol.  Limit alcohol intake to no more than 1 drink per day for nonpregnant women. One drink equals 12 ounces of beer, 5 ounces of wine, or 1 ounces of hard liquor.  Do not use street drugs.  Do not share needles.  Ask your health care  provider for help if you need support or information about quitting drugs.  Tell your health care provider if you often feel depressed.  Tell your health care provider if you have ever been abused or do not feel safe at home. This information is not intended to replace advice given to you by your health care provider. Make sure you discuss any questions you have with your health care provider. Document Released: 05/11/2011 Document Revised: 04/02/2016 Document Reviewed: 07/30/2015 Elsevier Interactive Patient Education  2018 [place]. Hemoglobin A1c Test Some of the sugar (glucose) that circulates in your blood sticks or binds to blood proteins. Hemoglobin (Hb or Hgb) is one type of blood protein that glucose binds to. It also carries oxygen in the red blood cells (RBCs). When glucose binds to Hb, the glucose-coated Hb is called glycated Hb. Once Hb is glycated, it remains that way for the life of the RBC. This is about 120 days. Rather than testing your blood glucose level on one single day, the hemoglobin A1c (HbA1c) test measures the average amount of glycated hemoglobin and, therefore, the average amount of glucose in your blood during the 3-4 months just before the test is done. The HbA1c test is used to monitor long-term control of blood sugar in people who have diabetes mellitus. The HbA1c test can also be used in addition to or in combination with fasting blood glucose level and oral glucose tolerance tests. What do the results mean? It is your responsibility to obtain your test results. Ask the lab or department performing the test when and how you will get your results. Contact your health care provider to discuss any questions you have about your results. Range of Normal Values Ranges for normal values may vary among different labs and hospitals. You should always check with your health care provider after having lab work or other tests done to discuss the meaning of your test results  and whether your values are considered within normal limits. The  ranges for normal HbA1c test results are as follows:  Adult or child without diabetes: 4-5.9%.  Adult or child with diabetes and good blood glucose control: less than 6.5%.  Several factors can affect HbA1c test results. These may include:  Diseases (hemoglobinopathies) that cause a change in the shape, size, or amount of Hb in your blood.  Longer than normal RBC life span.  Abnormally low levels of certain proteins in your blood.  Eating foods or taking supplements that are high in vitamin C (ascorbic acid).  Meaning of Results Outside Normal Value Ranges Abnormally high HbA1c values are most commonly an indication of prediabetes mellitus and diabetes mellitus:  An HbA1c result of 5.7-6.4% is considered diagnostic of prediabetes mellitus.  An HbA1c result of 6.5% or higher on two separate occasions is considered diagnostic of diabetes mellitus.  Abnormally low HbA1c values can be caused by several health conditions. These may include:  Pregnancy.  A large amount of blood loss.  Blood transfusions.  Low red blood cell count (anemia). This is caused by premature destruction of red blood cells.  Long-term kidney failure.  Some unusual forms of Hb (Hb variants), such as sickle cell trait.  Discuss your test results with your health care provider. He or she will use the results to make a diagnosis and determine a treatment plan that is right for you. Talk with your health care provider to discuss your results, treatment options, and if necessary, the need for more tests. Talk with your health care provider if you have any questions about your results. This information is not intended to replace advice given to you by your health care provider. Make sure you discuss any questions you have with your health care provider. Document Released: 11/17/2004 Document Revised: 07/22/2016 Document Reviewed: 03/12/2014 Elsevier  Interactive Patient Education  2018 [employer].  Prediabetes Eating Plan Prediabetes-also called impaired glucose tolerance or impaired fasting glucose-is a condition that causes blood sugar (blood glucose) levels to be higher than normal. Following a healthy diet can help to keep prediabetes under control. It can also help to lower the risk of type 2 diabetes and heart disease, which are increased in people who have prediabetes. Along with regular exercise, a healthy diet:  Promotes weight loss.  Helps to control blood sugar levels.  Helps to improve the way that the body uses insulin.  What do I need to know about this eating plan?  Use the glycemic index (GI) to plan your meals. The index tells you how quickly a food will raise your blood sugar. Choose low-GI foods. These foods take a longer time to raise blood sugar.  Pay close attention to the amount of carbohydrates in the food that you eat. Carbohydrates increase blood sugar levels.  Keep track of how many calories you take in. Eating the right amount of calories will help you to achieve a healthy weight. Losing about 7 percent of your starting weight can help to prevent type 2 diabetes.  You may want to follow a Mediterranean diet. This diet includes a lot of vegetables, lean meats or fish, whole grains, fruits, and healthy oils and fats. What foods can I eat? Grains Whole grains, such as whole-wheat or whole-grain breads, crackers, cereals, and pasta. Unsweetened oatmeal. Bulgur. Barley. Quinoa. Brown rice. Corn or whole-wheat flour tortillas or taco shells. Vegetables Lettuce. Spinach. Peas. Beets. Cauliflower. Cabbage. Broccoli. Carrots. Tomatoes. Squash. Eggplant. Herbs. Peppers. Onions. Cucumbers. Brussels sprouts. Fruits Berries. Bananas. Apples. Oranges. Grapes. Papaya. Mango. Pomegranate.  Kiwi. Grapefruit. Cherries. Meats and Other Protein Sources Seafood. Lean meats, such as chicken and [place] or lean cuts of pork and  beef. Tofu. Eggs. Nuts. Beans. Dairy Low-fat or fat-free dairy products, such as yogurt, cottage cheese, and cheese. Beverages Water. Tea. Coffee. Sugar-free or diet soda. Seltzer water. Milk. Milk alternatives, such as soy or almond milk. Condiments Mustard. Relish. Low-fat, low-sugar ketchup. Low-fat, low-sugar barbecue sauce. Low-fat or fat-free mayonnaise. Sweets and Desserts Sugar-free or low-fat pudding. Sugar-free or low-fat ice cream and other frozen treats. Fats and Oils Avocado. Walnuts. Olive oil. The items listed above may not be a complete list of recommended foods or beverages. Contact your dietitian for more options. What foods are not recommended? Grains Refined white flour and flour products, such as bread, pasta, snack foods, and cereals. Beverages Sweetened drinks, such as sweet iced tea and soda. Sweets and Desserts Baked goods, such as cake, cupcakes, pastries, cookies, and cheesecake. The items listed above may not be a complete list of foods and beverages to avoid. Contact your dietitian for more information. This information is not intended to replace advice given to you by your health care provider. Make sure you discuss any questions you have with your health care provider. Document Released: 03/12/2015 Document Revised: 04/02/2016 Document Reviewed: 11/21/2014 Elsevier Interactive Patient Education  2017 [place].  Colonoscopy, Adult A colonoscopy is an exam to look at the entire large intestine. During the exam, a lubricated, bendable tube is inserted into the anus and then passed into the rectum, colon, and other parts of the large intestine. A colonoscopy is often done as a part of normal colorectal screening or in response to certain symptoms, such as anemia, persistent diarrhea, abdominal pain, and blood in the stool. The exam can help screen for and diagnose medical problems, including:  Tumors.  Polyps.  Inflammation.  Areas of  bleeding.  Tell a health care provider about:  Any allergies you have.  All medicines you are taking, including vitamins, herbs, eye drops, creams, and over-the-counter medicines.  Any problems you or family members have had with anesthetic medicines.  Any blood disorders you have.  Any surgeries you have had.  Any medical conditions you have.  Any problems you have had passing stool. What are the risks? Generally, this is a safe procedure. However, problems may occur, including:  Bleeding.  A tear in the intestine.  A reaction to medicines given during the exam.  Infection (rare).  What happens before the procedure? Eating and drinking restrictions Follow instructions from your health care provider about eating and drinking, which may include:  A few days before the procedure - follow a low-fiber diet. Avoid nuts, seeds, dried fruit, raw fruits, and vegetables.  1-3 days before the procedure - follow a clear liquid diet. Drink only clear liquids, such as clear broth or bouillon, black coffee or tea, clear juice, clear soft drinks or sports drinks, gelatin dessert, and popsicles. Avoid any liquids that contain red or purple dye.  On the day of the procedure - do not eat or drink anything during the 2 hours before the procedure, or within the time period that your health care provider recommends.  Bowel prep If you were prescribed an oral bowel prep to clean out your colon:  Take it as told by your health care provider. Starting the day before your procedure, you will need to drink a large amount of medicated liquid. The liquid will cause you to have multiple loose stools until  your stool is almost clear or light green.  If your skin or anus gets irritated from diarrhea, you may use these to relieve the irritation: ? Medicated wipes, such as adult wet wipes with aloe and vitamin E. ? A skin soothing-product like petroleum jelly.  If you vomit while drinking the bowel  prep, take a break for up to 60 minutes and then begin the bowel prep again. If vomiting continues and you cannot take the bowel prep without vomiting, call your health care provider.  General instructions  Ask your health care provider about changing or stopping your regular medicines. This is especially important if you are taking diabetes medicines or blood thinners.  Plan to have someone take you home from the hospital or clinic. What happens during the procedure?  An IV tube may be inserted into one of your veins.  You will be given medicine to help you relax (sedative).  To reduce your risk of infection: ? Your health care team will wash or sanitize their hands. ? Your anal area will be washed with soap.  You will be asked to lie on your side with your knees bent.  Your health care provider will lubricate a long, thin, flexible tube. The tube will have a camera and a light on the end.  The tube will be inserted into your anus.  The tube will be gently eased through your rectum and colon.  Air will be delivered into your colon to keep it open. You may feel some pressure or cramping.  The camera will be used to take images during the procedure.  A small tissue sample may be removed from your body to be examined under a microscope (biopsy). If any potential problems are found, the tissue will be sent to a lab for testing.  If small polyps are found, your health care provider may remove them and have them checked for cancer cells.  The tube that was inserted into your anus will be slowly removed. The procedure may vary among health care providers and hospitals. What happens after the procedure?  Your blood pressure, heart rate, breathing rate, and blood oxygen level will be monitored until the medicines you were given have worn off.  Do not drive for 24 hours after the exam.  You may have a small amount of blood in your stool.  You may pass gas and have mild abdominal  cramping or bloating due to the air that was used to inflate your colon during the exam.  It is up to you to get the results of your procedure. Ask your health care provider, or the department performing the procedure, when your results will be ready. This information is not intended to replace advice given to you by your health care provider. Make sure you discuss any questions you have with your health care provider. Document Released: 10/23/2000 Document Revised: 08/26/2016 Document Reviewed: 01/07/2016 Elsevier Interactive Patient Education  2018 [employer].  Cholesterol Cholesterol is a fat. Your body needs a small amount of cholesterol. Cholesterol (plaque) may build up in your blood vessels (arteries). That makes you more likely to have a heart attack or stroke. You cannot feel your cholesterol level. Having a blood test is the only way to find out if your level is high. Keep your test results. Work with your doctor to keep your cholesterol at a good level. What do the results mean?  Total cholesterol is how much cholesterol is in your blood.  LDL is bad  cholesterol. This is the type that can build up. Try to have low LDL.  HDL is good cholesterol. It cleans your blood vessels and carries LDL away. Try to have high HDL.  Triglycerides are fat that the body can store or burn for energy. What are good levels of cholesterol?  Total cholesterol below 200.  LDL below 100 is good for people who have health risks. LDL below 70 is good for people who have very high risks.  HDL above 40 is good. It is best to have HDL of 60 or higher.  Triglycerides below 150. How can I lower my cholesterol? Diet Follow your diet program as told by your doctor.  Choose fish, white meat chicken, or [place] that is roasted or baked. Try not to eat red meat, fried foods, sausage, or lunch meats.  Eat lots of fresh fruits and vegetables.  Choose whole grains, beans, pasta, potatoes, and  cereals.  Choose olive oil, corn oil, or canola oil. Only use small amounts.  Try not to eat butter, mayonnaise, shortening, or palm kernel oils.  Try not to eat foods with trans fats.  Choose low-fat or nonfat dairy foods. ? Drink skim or nonfat milk. ? Eat low-fat or nonfat yogurt and cheeses. ? Try not to drink whole milk or cream. ? Try not to eat ice cream, egg yolks, or full-fat cheeses.  Healthy desserts include angel food cake, ginger snaps, animal crackers, hard candy, popsicles, and low-fat or nonfat frozen yogurt. Try not to eat pastries, cakes, pies, and cookies.  Exercise Follow your exercise program as told by your doctor.  Be more active. Try gardening, walking, and taking the stairs.  Ask your doctor about ways that you can be more active.  Medicine  Take over-the-counter and prescription medicines only as told by your doctor. This information is not intended to replace advice given to you by your health care provider. Make sure you discuss any questions you have with your health care provider. Document Released: 01/22/2009 Document Revised: 05/27/2016 Document Reviewed: 05/07/2016 Elsevier Interactive Patient Education  2018 [employer]. Cholesterol Cholesterol is a fat. Your body needs a small amount of cholesterol. Cholesterol (plaque) may build up in your blood vessels (arteries). That makes you more likely to have a heart attack or stroke. You cannot feel your cholesterol level. Having a blood test is the only way to find out if your level is high. Keep your test results. Work with your doctor to keep your cholesterol at a good level. What do the results mean?  Total cholesterol is how much cholesterol is in your blood.  LDL is bad cholesterol. This is the type that can build up. Try to have low LDL.  HDL is good cholesterol. It cleans your blood vessels and carries LDL away. Try to have high HDL.  Triglycerides are fat that the body can store or burn  for energy. What are good levels of cholesterol?  Total cholesterol below 200.  LDL below 100 is good for people who have health risks. LDL below 70 is good for people who have very high risks.  HDL above 40 is good. It is best to have HDL of 60 or higher.  Triglycerides below 150. How can I lower my cholesterol? Diet Follow your diet program as told by your doctor.  Choose fish, white meat chicken, or [place] that is roasted or baked. Try not to eat red meat, fried foods, sausage, or lunch meats.  Eat lots of fresh  fruits and vegetables.  Choose whole grains, beans, pasta, potatoes, and cereals.  Choose olive oil, corn oil, or canola oil. Only use small amounts.  Try not to eat butter, mayonnaise, shortening, or palm kernel oils.  Try not to eat foods with trans fats.  Choose low-fat or nonfat dairy foods. ? Drink skim or nonfat milk. ? Eat low-fat or nonfat yogurt and cheeses. ? Try not to drink whole milk or cream. ? Try not to eat ice cream, egg yolks, or full-fat cheeses.  Healthy desserts include angel food cake, ginger snaps, animal crackers, hard candy, popsicles, and low-fat or nonfat frozen yogurt. Try not to eat pastries, cakes, pies, and cookies.  Exercise Follow your exercise program as told by your doctor.  Be more active. Try gardening, walking, and taking the stairs.  Ask your doctor about ways that you can be more active.  Medicine  Take over-the-counter and prescription medicines only as told by your doctor. This information is not intended to replace advice given to you by your health care provider. Make sure you discuss any questions you have with your health care provider. Document Released: 01/22/2009 Document Revised: 05/27/2016 Document Reviewed: 05/07/2016 Elsevier Interactive Patient Education  [employer].

## 2018-06-03 ENCOUNTER — Other Ambulatory Visit: Payer: Self-pay

## 2018-06-03 ENCOUNTER — Telehealth: Payer: Self-pay | Admitting: Gastroenterology

## 2018-06-03 DIAGNOSIS — Z1211 Encounter for screening for malignant neoplasm of colon: Secondary | ICD-10-CM

## 2018-06-03 NOTE — Telephone Encounter (Signed)
Gastroenterology Pre-Procedure Review  Request Date: 06/23/18  [facility] Requesting Physician: Dr. Tobi BastosAnna   PATIENT REVIEW QUESTIONS: The patient responded to the following health history questions as indicated:    1. Are you having any GI issues? no 2. Do you have a personal history of Polyps? no 3. Do you have a family history of Colon Cancer or Polyps? yes (Mother - polyps) 4. Diabetes Mellitus? no 5. Joint replacements in the past 12 months?no 6. Major health problems in the past 3 months?no 7. Any artificial heart valves, MVP, or defibrillator?no    MEDICATIONS & ALLERGIES:    Patient reports the following regarding taking any anticoagulation/antiplatelet therapy:   Plavix, Coumadin, Eliquis, Xarelto, Lovenox, Pradaxa, Brilinta, or Effient? no Aspirin? no  Patient confirms/reports the following medications:  Current Outpatient Medications  Medication Sig Dispense Refill  . Calcium Carbonate-Vitamin D 600-400 MG-UNIT tablet Take 1 tablet by mouth daily.     . Cholecalciferol (VITAMIN D PO) Take 1,000 mg by mouth 2 (two) times daily.    . hydrochlorothiazide (HYDRODIURIL) 25 MG tablet Take 1 tablet (25 mg total) by mouth daily. 90 tablet 1  . Omega-3 Fatty Acids (FISH OIL) 1000 MG CAPS Take 1,000 mg by mouth daily.     No current facility-administered medications for this visit.     Patient confirms/reports the following allergies:  Allergies  Allergen Reactions  . Penicillins Rash  . Sulfa Antibiotics Rash  . Lisinopril Cough    No orders of the defined types were placed in this encounter.   AUTHORIZATION INFORMATION Primary Insurance: 1D#: Group #:  Secondary Insurance: 1D#: Group #:  SCHEDULE INFORMATION: Date: 06/23/18    Tobi BastosAnna Time: Location: [facility]

## 2018-06-22 ENCOUNTER — Encounter: Payer: Self-pay | Admitting: Emergency Medicine

## 2018-06-23 ENCOUNTER — Encounter
Admission: RE | Disposition: A | Discharge status: Home / Self Care | Payer: Self-pay | Source: Ambulatory Visit | Attending: Gastroenterology

## 2018-06-23 ENCOUNTER — Ambulatory Visit: Payer: BLUE CROSS/BLUE SHIELD | Admitting: Certified Registered"

## 2018-06-23 ENCOUNTER — Ambulatory Visit
Admission: RE | Admit: 2018-06-23 | Discharge: 2018-06-23 | Disposition: A | Discharge status: Home / Self Care | Payer: BLUE CROSS/BLUE SHIELD | Source: Ambulatory Visit | Attending: Gastroenterology | Admitting: Gastroenterology

## 2018-06-23 DIAGNOSIS — Z1211 Encounter for screening for malignant neoplasm of colon: Secondary | ICD-10-CM | POA: Diagnosis not present

## 2018-06-23 DIAGNOSIS — E785 Hyperlipidemia, unspecified: Secondary | ICD-10-CM | POA: Insufficient documentation

## 2018-06-23 DIAGNOSIS — I10 Essential (primary) hypertension: Secondary | ICD-10-CM | POA: Insufficient documentation

## 2018-06-23 DIAGNOSIS — Z8371 Family history of colonic polyps: Secondary | ICD-10-CM | POA: Insufficient documentation

## 2018-06-23 DIAGNOSIS — Z882 Allergy status to sulfonamides status: Secondary | ICD-10-CM | POA: Diagnosis not present

## 2018-06-23 DIAGNOSIS — Z880 Allergy status to penicillin: Secondary | ICD-10-CM | POA: Diagnosis not present

## 2018-06-23 HISTORY — PX: COLONOSCOPY WITH PROPOFOL: SHX5780

## 2018-06-23 LAB — POCT PREGNANCY, URINE: PREG TEST UR: NEGATIVE

## 2018-06-23 SURGERY — COLONOSCOPY WITH PROPOFOL
Anesthesia: General

## 2018-06-23 MED ORDER — LIDOCAINE HCL (CARDIAC) PF 100 MG/5ML IV SOSY
PREFILLED_SYRINGE | INTRAVENOUS | Status: DC | PRN
  Administered 2018-06-23: 50 mg via INTRAVENOUS

## 2018-06-23 MED ORDER — PROPOFOL 10 MG/ML IV BOLUS
INTRAVENOUS | Status: DC | PRN
  Administered 2018-06-23: 20 mg via INTRAVENOUS
  Administered 2018-06-23: 60 mg via INTRAVENOUS

## 2018-06-23 MED ORDER — PROPOFOL 500 MG/50ML IV EMUL
INTRAVENOUS | Status: DC | PRN
  Administered 2018-06-23: 130 ug/kg/min via INTRAVENOUS

## 2018-06-23 MED ORDER — LIDOCAINE HCL (PF) 2 % IJ SOLN
INTRAMUSCULAR | Status: AC
  Filled 2018-06-23: qty 10

## 2018-06-23 MED ORDER — SODIUM CHLORIDE 0.9 % IV SOLN
INTRAVENOUS | Status: DC
  Administered 2018-06-23: 1000 mL via INTRAVENOUS

## 2018-06-23 MED ORDER — PROPOFOL 500 MG/50ML IV EMUL
INTRAVENOUS | Status: AC
  Filled 2018-06-23: qty 50

## 2018-06-23 NOTE — Anesthesia Postprocedure Evaluation (Signed)
Anesthesia Post Note  Patient: Natalie Vargas  Procedure(s) Performed: COLONOSCOPY WITH PROPOFOL (N/A )  Patient location during evaluation: PACU Anesthesia Type: General Level of consciousness: awake and alert Pain management: pain level controlled Vital Signs Assessment: post-procedure vital signs reviewed and stable Respiratory status: spontaneous breathing, nonlabored ventilation and respiratory function stable Cardiovascular status: blood pressure returned to baseline and stable Postop Assessment: no apparent nausea or vomiting Anesthetic complications: no     Last Vitals:  Vitals:   06/23/18 0949 06/23/18 0959  BP: 113/76 112/87  Pulse:    Resp:    Temp:    SpO2:      Last Pain:  Vitals:   06/23/18 0949  TempSrc:   PainSc: 0-No pain                 Jovita GammaKathryn L Fitzgerald

## 2018-06-23 NOTE — Op Note (Signed)
[facility] Gastroenterology Patient Name: Natalie BaldingLaura Vargas Procedure Date: 06/23/2018 9:04 AM MRN: [id number] Account #: [id number] Date of Birth: 1966-04-20 Admit Type: Outpatient Age: [age] [facility] ROOM 3 Gender: Female Note Status: Finalized Procedure:            Colonoscopy Indications:          Colon cancer screening in patient at increased risk:                        Family history of 1st-degree relative with colon polyps Providers:            Wyline MoodKiran Emslee Lopezmartinez MD, MD Referring MD:         Eula FriedMichelle S. Flinchum (Referring MD) Medicines:            Monitored Anesthesia Care Complications:        No immediate complications. Procedure:            Pre-Anesthesia Assessment:                       - Prior to the procedure, a History and Physical was                        performed, and patient medications, allergies and                        sensitivities were reviewed. The patient's tolerance of                        previous anesthesia was reviewed.                       - The risks and benefits of the procedure and the                        sedation options and risks were discussed with the                        patient. All questions were answered and informed                        consent was obtained.                       - ASA Grade Assessment: II - A patient with mild                        systemic disease.                       After obtaining informed consent, the colonoscope was                        passed under direct vision. Throughout the procedure,                        the patient's blood pressure, pulse, and oxygen                        saturations were monitored continuously. The  Colonoscope was introduced through the anus and                        advanced to the the cecum, identified by the                        appendiceal orifice, IC valve and transillumination.                        The colonoscopy was  performed with ease. The patient                        tolerated the procedure well. The quality of the bowel                        preparation was adequate. Findings:      The entire examined colon appeared normal on direct and retroflexion       views. Impression:           - The entire examined colon is normal on direct and                        retroflexion views.                       - No specimens collected. Recommendation:       - Discharge patient to home (with escort).                       - Resume previous diet.                       - Continue present medications.                       - Repeat colonoscopy in 5 years for surveillance. Procedure Code(s):    --- Professional ---                       [phone], Colonoscopy, flexible; diagnostic, including                        collection of specimen(s) by brushing or washing, when                        performed (separate procedure) Diagnosis Code(s):    --- Professional ---                       Z83.71, Family history of colonic polyps CPT copyright 2017 American Medical Association. All rights reserved. The codes documented in this report are preliminary and upon coder review may  be revised to meet current compliance requirements. Wyline MoodKiran Macen Joslin, MD Wyline MoodKiran Wen Munford MD, MD 06/23/2018 9:28:56 AM This report has been signed electronically. Number of Addenda: 0 Note Initiated On: 06/23/2018 9:04 AM Scope Withdrawal Time: 0 hours 15 minutes 23 seconds  Total Procedure Duration: 0 hours 18 minutes 45 seconds       [facility]

## 2018-06-23 NOTE — H&P (Signed)
Natalie MoodKiran Ruhi Kopke, MD [address], [address], [place], [place], [address] [address], [address], [place], [place], [address] Phone: [phone]  Fax: [phone]  Primary Care Physician:  Berniece PapFlinchum, Michelle S, FNP   Pre-Procedure History & Physical: HPI:  Natalie Vargas Natalie Vargas is a 52 y.o. female is here for an colonoscopy.   Past Medical History:  Diagnosis Date  . Allergy   . 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 Hyperlipidemia   . Hypertension     Past Surgical History:  Procedure Laterality Date  . CESAREAN SECTION  1994    Prior to Admission medications   Medication Sig Start Date End Date Taking? Authorizing Provider  Calcium Carbonate-Vitamin Vargas 600-400 MG-UNIT tablet Take 1 tablet by mouth daily.    Yes [provider]  Cholecalciferol (VITAMIN Vargas PO) Take 1,000 mg by mouth 2 (two) times daily.   Yes [provider]  hydrochlorothiazide (HYDRODIURIL) 25 MG tablet Take 1 tablet (25 mg total) by mouth daily. 06/02/18  Yes Flinchum, Eula FriedMichelle S, FNP  Omega-3 Fatty Acids (FISH OIL) 1000 MG CAPS Take 1,000 mg by mouth daily.   Yes [provider]    Allergies as of 06/03/2018 - Review Complete 06/02/2018  Allergen Reaction Noted  . Penicillins Rash 02/07/2013  . Sulfa antibiotics Rash 02/07/2013  . Lisinopril Cough 03/23/2016    Family History  Problem Relation Age of Onset  . Hypertension Mother   . Hyperlipidemia Mother   . Cancer Father   . Hyperlipidemia Father   . Congestive Heart Failure Father   . Skin cancer Father   . Stroke Paternal Aunt   . Breast cancer Neg Hx     Social History   Socioeconomic History  . Marital status: Married    Spouse name: Not on file  . Number of children: Not on file  . Years of education: Not on file  . Highest education level: Not on file  Occupational History  . Not on file  Social Needs  . Financial resource strain: Not on file  . Food insecurity:   Worry: Not on file    Inability: Not on file  . Transportation needs:    Medical: Not on file    Non-medical: Not on file  Tobacco Use  . Smoking status: Never Smoker  . Smokeless tobacco: Never Used  Substance and Sexual Activity  . Alcohol use: Not on file  . Drug use: No  . Sexual activity: Yes    Birth control/protection: Condom    Comment: Vasectomy   Lifestyle  . Physical activity:    Days per week: Not on file    Minutes per session: Not on file  . Stress: Not on file  Relationships  . Social connections:    Talks on phone: Not on file    Gets together: Not on file    Attends religious service: Not on file    Active member of club or organization: Not on file    Attends meetings of clubs or organizations: Not on file    Relationship status: Not on file  . Intimate partner violence:    Fear of current or ex partner: Not on file    Emotionally abused: Not on file    Physically abused: Not on file    Forced sexual activity: Not on file  Other Topics Concern  . Not on file  Social History Narrative  . Not on  file    Review of Systems: See HPI, otherwise negative ROS  Physical Exam: BP 125/87   Pulse 76   Temp (!) 96.5 F (35.8 C) (Tympanic)   Resp 20   Ht 5\' 6"  (1.676 m)   Wt 63.5 kg   SpO2 100%   BMI 22.60 kg/m  General:   Alert,  pleasant and cooperative in NAD Head:  Normocephalic and atraumatic. Neck:  Supple; no masses or thyromegaly. Lungs:  Clear throughout to auscultation, normal respiratory effort.    Heart:  +S1, +S2, Regular rate and rhythm, No edema. Abdomen:  Soft, nontender and nondistended. Normal bowel sounds, without guarding, and without rebound.   Neurologic:  Alert and  oriented x4;  grossly normal neurologically.  Impression/Plan: Natalie Vargas Hasler is here for an colonoscopy to be performed for Screening colonoscopy high  risk   Risks, benefits, limitations, and alternatives regarding  colonoscopy have been reviewed with the patient.   Questions have been answered.  All parties agreeable.   Natalie MoodKiran Salman Wellen, MD  06/23/2018, 8:56 AM

## 2018-06-23 NOTE — Anesthesia Post-op Follow-up Note (Signed)
Anesthesia QCDR form completed.        

## 2018-06-23 NOTE — Anesthesia Preprocedure Evaluation (Addendum)
Anesthesia Evaluation  Patient identified by MRN, date of birth, ID band Patient awake    Reviewed: Allergy & Precautions, H&P , NPO status , Patient's Chart, lab work & pertinent test results  Airway Mallampati: II  TM Distance: >3 FB Neck ROM: full    Dental  (+) Teeth Intact, Dental Advidsory Given   Pulmonary neg pulmonary ROS, neg COPD,    breath sounds clear to auscultation       Cardiovascular hypertension, On Medications (-) Past MI, (-) Cardiac Stents and (-) CABG negative cardio ROS  (-) dysrhythmias  Rhythm:regular Rate:Normal     Neuro/Psych negative neurological ROS  negative psych ROS   GI/Hepatic negative GI ROS, Neg liver ROS,   Endo/Other  negative endocrine ROS  Renal/GU negative Renal ROS  negative genitourinary   Musculoskeletal   Abdominal   Peds  Hematology negative hematology ROS (+)   Anesthesia Other Findings Past Medical History: No date: Allergy No date: Blood transfusion without reported diagnosis     Comment:  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  No date: Hyperlipidemia No date: Hypertension  Past Surgical History: 1994: CESAREAN SECTION  BMI    Body Mass Index:  22.60 kg/m      Reproductive/Obstetrics negative OB ROS                            Anesthesia Physical Anesthesia Plan  ASA: II  Anesthesia Plan: General   Post-op Pain Management:    Induction: Intravenous  PONV Risk Score and Plan: Propofol infusion and TIVA  Airway Management Planned: Nasal Cannula  Additional Equipment:   Intra-op Plan:   Post-operative Plan:   Informed Consent: I have reviewed the patients History and Physical, chart, labs and discussed the procedure including the risks, benefits and alternatives for the proposed anesthesia with the patient or authorized representative who has indicated his/her understanding and acceptance.   Dental Advisory Given  Plan Discussed  with: Anesthesiologist, CRNA and Surgeon  Anesthesia Plan Comments:        Anesthesia Quick Evaluation

## 2018-06-23 NOTE — Transfer of Care (Signed)
Immediate Anesthesia Transfer of Care Note  Patient: Natalie Vargas  Procedure(s) Performed: COLONOSCOPY WITH PROPOFOL (N/A )  Patient Location: PACU  Anesthesia Type:General  Level of Consciousness: sedated  Airway & Oxygen Therapy: Patient Spontanous Breathing and Patient connected to nasal cannula oxygen  Post-op Assessment: Report given to RN and Post -op Vital signs reviewed and stable  Post vital signs: Reviewed and stable  Last Vitals:  Vitals Value Taken Time  BP    Temp    Pulse 62 06/23/2018  9:29 AM  Resp 13 06/23/2018  9:29 AM  SpO2 100 % 06/23/2018  9:29 AM  Vitals shown include unvalidated device data.  Last Pain:  Vitals:   06/23/18 0929  TempSrc: (P) Tympanic  PainSc:          Complications: No apparent anesthesia complications

## 2018-06-23 NOTE — Anesthesia Procedure Notes (Signed)
Performed by: Terrie Grajales, CRNA Pre-anesthesia Checklist: Patient identified, Emergency Drugs available, Suction available, Patient being monitored and Timeout performed Patient Re-evaluated:Patient Re-evaluated prior to induction Oxygen Delivery Method: Nasal cannula Induction Type: IV induction       

## 2018-06-27 ENCOUNTER — Encounter: Payer: Self-pay | Admitting: Gastroenterology

## 2018-07-12 ENCOUNTER — Other Ambulatory Visit: Payer: Self-pay | Admitting: Obstetrics and Gynecology

## 2018-07-12 DIAGNOSIS — Z1231 Encounter for screening mammogram for malignant neoplasm of breast: Secondary | ICD-10-CM

## 2018-08-01 ENCOUNTER — Other Ambulatory Visit: Payer: Self-pay | Admitting: Adult Health

## 2018-08-04 ENCOUNTER — Ambulatory Visit: Payer: Self-pay

## 2018-08-05 ENCOUNTER — Other Ambulatory Visit: Payer: Self-pay | Admitting: Adult Health

## 2018-08-05 MED ORDER — HYDROCHLOROTHIAZIDE 25 MG PO TABS: 25.0000 mg | ORAL_TABLET | Freq: Every day | ORAL | 1 refills | Status: DC

## 2018-08-05 NOTE — Progress Notes (Signed)
Meds ordered this encounter  Medications  . hydrochlorothiazide (HYDRODIURIL) 25 MG tablet    Sig: Take 1 tablet (25 mg total) by mouth daily.    Dispense:  90 tablet    Refill:  1    Refill per patient my chart message she is aware from last visit when she needs to follow up.

## 2018-08-31 ENCOUNTER — Ambulatory Visit
Admission: RE | Admit: 2018-08-31 | Discharge: 2018-08-31 | Disposition: A | Discharge status: Home / Self Care | Payer: BLUE CROSS/BLUE SHIELD | Source: Ambulatory Visit | Attending: Obstetrics and Gynecology | Admitting: Obstetrics and Gynecology

## 2018-08-31 ENCOUNTER — Ambulatory Visit: Payer: Self-pay | Admitting: Adult Health

## 2018-08-31 DIAGNOSIS — Z1231 Encounter for screening mammogram for malignant neoplasm of breast: Secondary | ICD-10-CM | POA: Diagnosis not present

## 2018-09-01 ENCOUNTER — Ambulatory Visit: Payer: Self-pay | Admitting: Medical

## 2018-09-01 ENCOUNTER — Encounter: Payer: Self-pay | Admitting: Medical

## 2018-09-01 ENCOUNTER — Other Ambulatory Visit: Payer: Self-pay | Admitting: Medical

## 2018-09-01 VITALS — BP 131/86 | HR 80 | Temp 97.5°F | Resp 16 | Ht 66.0 in | Wt 149.0 lb

## 2018-09-01 DIAGNOSIS — R109 Unspecified abdominal pain: Secondary | ICD-10-CM

## 2018-09-01 LAB — POCT URINALYSIS DIPSTICK
Appearance: NORMAL
BILIRUBIN UA: NEGATIVE
GLUCOSE UA: NEGATIVE
KETONES UA: NEGATIVE
Nitrite, UA: NEGATIVE
PH UA: 7 (ref 5.0–8.0)
Protein, UA: NEGATIVE
RBC UA: NEGATIVE
Spec Grav, UA: 1.01 (ref 1.010–1.025)
UROBILINOGEN UA: 0.2 U/dL

## 2018-09-01 LAB — POCT URINE PREGNANCY: Preg Test, Ur: NEGATIVE

## 2018-09-01 NOTE — Progress Notes (Signed)
Subjective:    Patient ID: Natalie Vargas, female    DOB: [birth year], 52 y.o.   MRN: [id number]  HPI 52 yo female in non acute distress.  Presents today with complaints of pain on a right side of the last  2 weeks.    2 weeks ago did a HITT workout, bending over felt something  poking her in her 4/10. Only with bending over. Finished workout. The next day felt bruised, and it is right of a location of a c-section. Now it is more constant of a feeling aching, pressure.  Had not notice any swelling. Did not exercise last week due to her being at a conference. Did not chanage the presence of the sensation.   c- section x 1  1994  Hx of  "tennis elbow bilateral"  6 plus months on/off,  Self dianoised  Weakness of grip noted per patient. Blood pressure 131/86, pulse 80, temperature (!) 97.5 F (36.4 C), resp. rate 16, height 5' 6"  (1.676 m), weight 149 lb (67.6 kg), last menstrual period 08/22/2018, SpO2 100 %.   Allergies  Allergen Reactions  . Penicillins Rash  . Sulfa Antibiotics Rash  . Lisinopril Cough   Review of Systems  Constitutional: Negative.   Respiratory: Negative.   Cardiovascular: Negative.   Gastrointestinal: Positive for abdominal pain. Negative for abdominal distention, anal bleeding, blood in stool, constipation, diarrhea, nausea, rectal pain and vomiting.  Genitourinary: Positive for pelvic pain (possibly). Negative for decreased urine volume, difficulty urinating, dyspareunia, dysuria, enuresis, flank pain, frequency, hematuria, menstrual problem, urgency, vaginal bleeding, vaginal discharge and vaginal pain.  Musculoskeletal: Negative for myalgias.  Skin: Negative for rash.  Neurological: Positive for speech difficulty. Negative for dizziness, syncope, weakness, light-headedness and headaches.  Hematological: Negative for adenopathy.  Psychiatric/Behavioral: Negative for behavioral problems, self-injury and suicidal ideas.   PT for pelvic flooring 2015 ,  adhesions around scar tissue     Objective:   Physical Exam  Constitutional: She appears well-developed and well-nourished.  HENT:  Head: Normocephalic and atraumatic.  Eyes: Pupils are equal, round, and reactive to light. EOM are normal.  Abdominal: Soft. Normal appearance. There is no hepatosplenomegaly. There is tenderness (see HPI for location right side of umbilicus ) in the periumbilical area. There is no rigidity, no rebound, no guarding, no tenderness at McBurney's point and negative Murphy's sign. No hernia. Hernia confirmed negative in the ventral area, confirmed negative in the right inguinal area and confirmed negative in the left inguinal area.  Skin: Skin is warm and dry.  Nursing note and vitals reviewed.     lateral to the umilicus on the right side feels "buised per patient" TTP 2/10   second area is on the right side of the lateral portion of the c-section scar TTP.2/10. No inguinal  Adenopathy,  2+ femoral pulses..  Urine dip trace of leuks sent for urine culture. Pregnancy test negative    Assessment & Plan:  abdominal pain superficial,. Right of umbilicus Right of inferior portion of vertical lower abdomen scar.  CBC, Met C, Urine  And Urine culture and Pregnancy test Placed in Epic. Called Ultrasound spoke to [place],  She recommended a limited U/S.  Patient appointment at  10am nothing to eat for 8 hours.  Reviewed time /location with patient. Patient has class at this time and will call U/S and reschedule appointment  (Oct. 31st am). CBC and Met C within normal limitsl. Reviewed with patient s/s of when to see care if off  is closed, redness, vomiting, increased pain etc. To seek out care at an urgent care or the Emergency Department . She verbalizes understanding and has no questions at discharge.

## 2018-09-01 NOTE — Patient Instructions (Signed)

## 2018-09-02 ENCOUNTER — Ambulatory Visit: Payer: BLUE CROSS/BLUE SHIELD

## 2018-09-02 ENCOUNTER — Other Ambulatory Visit: Payer: BLUE CROSS/BLUE SHIELD

## 2018-09-02 LAB — CBC WITH DIFFERENTIAL/PLATELET
BASOS ABS: 0.1 10*3/uL (ref 0.0–0.2)
Basos: 1 %
EOS (ABSOLUTE): 0.1 10*3/uL (ref 0.0–0.4)
Eos: 2 %
Hematocrit: 38.4 % (ref 34.0–46.6)
Hemoglobin: 13.1 g/dL (ref 11.1–15.9)
IMMATURE GRANS (ABS): 0 10*3/uL (ref 0.0–0.1)
IMMATURE GRANULOCYTES: 0 %
LYMPHS: 28 %
Lymphocytes Absolute: 1.9 10*3/uL (ref 0.7–3.1)
MCH: 31.3 pg (ref 26.6–33.0)
MCHC: 34.1 g/dL (ref 31.5–35.7)
MCV: 92 fL (ref 79–97)
Monocytes Absolute: 0.7 10*3/uL (ref 0.1–0.9)
Monocytes: 10 %
NEUTROS PCT: 59 %
Neutrophils Absolute: 3.9 10*3/uL (ref 1.4–7.0)
PLATELETS: 351 10*3/uL (ref 150–450)
RBC: 4.19 x10E6/uL (ref 3.77–5.28)
RDW: 11.7 % — ABNORMAL LOW (ref 12.3–15.4)
WBC: 6.7 10*3/uL (ref 3.4–10.8)

## 2018-09-02 LAB — COMPREHENSIVE METABOLIC PANEL
ALT: 15 IU/L (ref 0–32)
AST: 19 IU/L (ref 0–40)
Albumin/Globulin Ratio: 1.7 (ref 1.2–2.2)
Albumin: 4.5 g/dL (ref 3.5–5.5)
Alkaline Phosphatase: 75 IU/L (ref 39–117)
BUN/Creatinine Ratio: 14 (ref 9–23)
BUN: 13 mg/dL (ref 6–24)
Bilirubin Total: 0.2 mg/dL (ref 0.0–1.2)
CALCIUM: 9.5 mg/dL (ref 8.7–10.2)
CHLORIDE: 100 mmol/L (ref 96–106)
CO2: 26 mmol/L (ref 20–29)
CREATININE: 0.95 mg/dL (ref 0.57–1.00)
GFR, EST AFRICAN AMERICAN: 80 mL/min/{1.73_m2} (ref >59)
GFR, EST NON AFRICAN AMERICAN: 69 mL/min/{1.73_m2} (ref >59)
GLUCOSE: 84 mg/dL (ref 65–99)
Globulin, Total: 2.6 g/dL (ref 1.5–4.5)
Potassium: 4.1 mmol/L (ref 3.5–5.2)
Sodium: 141 mmol/L (ref 134–144)
TOTAL PROTEIN: 7.1 g/dL (ref 6.0–8.5)

## 2018-09-03 LAB — URINE CULTURE

## 2018-09-05 ENCOUNTER — Encounter: Payer: Self-pay | Admitting: Medical

## 2018-09-08 ENCOUNTER — Ambulatory Visit
Admission: RE | Admit: 2018-09-08 | Discharge: 2018-09-08 | Disposition: A | Discharge status: Home / Self Care | Payer: BLUE CROSS/BLUE SHIELD | Source: Ambulatory Visit | Attending: Medical | Admitting: Medical

## 2018-09-08 DIAGNOSIS — R109 Unspecified abdominal pain: Secondary | ICD-10-CM | POA: Insufficient documentation

## 2018-09-13 ENCOUNTER — Telehealth: Payer: Self-pay | Admitting: Medical

## 2018-09-13 NOTE — Telephone Encounter (Signed)
Reviewed results of abdominal ultrasound.   CLINICAL DATA:  Pain and inferior aspect of surgical scar.  EXAM: ULTRASOUND ABDOMEN LIMITED  COMPARISON:  None.  FINDINGS: Soft tissues along the course of the right paraumbilical scar are within normal limits. Visualized bowel is unremarkable.  IMPRESSION: Negative soft tissue ultrasound.   Electronically Signed   By: Marin Roberts M.D.   On: 09/08/2018 09:45  Patient verbalizes understanding and has no questions at the end of our conversation. If anything changes to return to the clinic for further evaluation.

## 2018-12-15 ENCOUNTER — Encounter: Payer: Self-pay | Admitting: Nurse Practitioner

## 2018-12-15 ENCOUNTER — Ambulatory Visit: Payer: Self-pay | Admitting: Nurse Practitioner

## 2018-12-15 VITALS — BP 128/88 | HR 69 | Temp 98.2°F | Resp 16 | Ht 66.0 in | Wt 156.0 lb

## 2018-12-15 DIAGNOSIS — M7581 Other shoulder lesions, right shoulder: Principal | ICD-10-CM

## 2018-12-15 DIAGNOSIS — M778 Other enthesopathies, not elsewhere classified: Secondary | ICD-10-CM

## 2018-12-15 DIAGNOSIS — M25522 Pain in left elbow: Secondary | ICD-10-CM

## 2018-12-15 DIAGNOSIS — M25521 Pain in right elbow: Secondary | ICD-10-CM

## 2018-12-15 MED ORDER — PREDNISONE 10 MG (21) PO TBPK: ORAL_TABLET | ORAL | 0 refills | Status: DC

## 2018-12-15 NOTE — Patient Instructions (Addendum)
Natalie Vargas it was nice meeting you today Please alternate ice and heat Can take over-the-counter Tylenol (500mg ) 2 tablets every 8 hours as needed for discomfort Take all of steroids as directed; may adjust as discussed if needed Avoid NSAIDS while on prednisone and use sparingly after that Return to the clinic if symptoms persist or do not worsen after course of treatment

## 2018-12-15 NOTE — Progress Notes (Signed)
   Subjective:    Patient ID: Natalie Vargas, female    DOB: 1966/02/03, 53 y.o.   MRN: [id number]  HPI Natalie Vargas comes to the office today with complaints of bilateral elbow pain x1 month. Reports this has "really been going on for longer than a year" but has been intermittent and since alternated hands and the discomfort comes and goes but when she has it, it's constant 2-3 and tightness or cramp feeling. She reports some numbness of the 4 and 5th digits to her dominent right hand. Concerned that her strength is weaker but able to grip and doesn't drop items. She reports she developed right shoulder discomfort today with no injury that is an ache. 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 She routinely works out but hasn't done anything strenuous this week and carries a "5 lb water pale" daily to feed her animals. She reports the right elbow is worse than the left. She reports she uses Aleve as needed and has taken it consistently for the last week with little to no relief.  She has been using ice frequently with little relief.  She reports the recent complaint of pain to the right elbow occurred after being pulled on the leash by her dog.  At that time she reported a "shot of electricity" to her elbow. She has a wrist support for her desk.   Review of Systems  Musculoskeletal: Positive for arthralgias. Negative for joint swelling and neck stiffness.  Skin: Negative for color change.       Objective:   Physical Exam Neck:     Musculoskeletal: Normal range of motion and neck supple. No muscular tenderness.  Cardiovascular:     Pulses: Normal pulses.     Comments: Bilateral radial pulses 2+ Musculoskeletal: Normal range of motion.        General: Tenderness present. No swelling or deformity.     Comments: FROM to bilateral upper extremities. Strength 5/5 to bilateral shoulders and 4/5 to right hand, 5/5 to left hand. Tenderness to right anterior humeral head. No tenderness to  bilateral elbows. Negative tinel's and phalens  Skin:    General: Skin is warm and dry.     Findings: No erythema.  Psychiatric:        Mood and Affect: Mood normal.           Assessment & Plan:

## 2019-06-08 ENCOUNTER — Other Ambulatory Visit: Payer: Self-pay | Admitting: Nurse Practitioner

## 2019-06-08 DIAGNOSIS — Z0000 Encounter for general adult medical examination without abnormal findings: Secondary | ICD-10-CM

## 2019-06-08 DIAGNOSIS — I10 Essential (primary) hypertension: Secondary | ICD-10-CM

## 2019-06-13 ENCOUNTER — Other Ambulatory Visit: Payer: Self-pay

## 2019-06-13 DIAGNOSIS — Z0000 Encounter for general adult medical examination without abnormal findings: Secondary | ICD-10-CM

## 2019-06-13 DIAGNOSIS — I10 Essential (primary) hypertension: Secondary | ICD-10-CM

## 2019-06-13 LAB — POCT URINALYSIS DIPSTICK
Bilirubin, UA: NEGATIVE
Glucose, UA: NEGATIVE
Ketones, UA: NEGATIVE
Leukocytes, UA: NEGATIVE
Nitrite, UA: NEGATIVE
Protein, UA: NEGATIVE
Spec Grav, UA: 1.015 (ref 1.010–1.025)
Urobilinogen, UA: 0.2 E.U./dL
pH, UA: 5 (ref 5.0–8.0)

## 2019-06-14 LAB — MICROSCOPIC EXAMINATION
Bacteria, UA: NONE SEEN
Casts: NONE SEEN /lpf
RBC, Urine: NONE SEEN /hpf (ref 0–2)

## 2019-06-14 LAB — CMP12+LP+7AC+CBC/PLT+HB A1C
ALT: 10 IU/L (ref 0–32)
AST: 18 IU/L (ref 0–40)
Albumin/Globulin Ratio: 1.8 (ref 1.2–2.2)
Albumin: 4.3 g/dL (ref 3.8–4.9)
Alkaline Phosphatase: 69 IU/L (ref 39–117)
BUN/Creatinine Ratio: 13 (ref 9–23)
BUN: 13 mg/dL (ref 6–24)
Bilirubin Total: 0.3 mg/dL (ref 0.0–1.2)
Bilirubin, Direct: 0.1 mg/dL (ref 0.00–0.40)
Calcium: 9.7 mg/dL (ref 8.7–10.2)
Chloride: 102 mmol/L (ref 96–106)
Chol/HDL Ratio: 3.3 ratio (ref 0.0–4.4)
Cholesterol, Total: 198 mg/dL (ref 100–199)
Creatinine, Ser: 1 mg/dL (ref 0.57–1.00)
GFR calc Af Amer: 75 mL/min/{1.73_m2} (ref >59)
GFR calc non Af Amer: 65 mL/min/{1.73_m2} (ref >59)
GGT: 14 IU/L (ref 0–60)
Globulin, Total: 2.4 g/dL (ref 1.5–4.5)
Glucose: 88 mg/dL (ref 65–99)
HDL: 60 mg/dL (ref >39)
Hematocrit: 40.5 % (ref 34.0–46.6)
Hemoglobin: 13.1 g/dL (ref 11.1–15.9)
Hgb A1c MFr Bld: 5.6 % (ref 4.8–5.6)
Iron: 137 ug/dL (ref 27–159)
LDH: 326 IU/L — ABNORMAL HIGH (ref 119–226)
LDL Calculated: 117 mg/dL — ABNORMAL HIGH (ref 0–99)
MCH: 30.8 pg (ref 26.6–33.0)
MCHC: 32.3 g/dL (ref 31.5–35.7)
MCV: 95 fL (ref 79–97)
Phosphorus: 3.5 mg/dL (ref 3.0–4.3)
Platelets: 296 10*3/uL (ref 150–450)
Potassium: 4.3 mmol/L (ref 3.5–5.2)
RBC: 4.25 x10E6/uL (ref 3.77–5.28)
RDW: 12.7 % (ref 11.7–15.4)
Sodium: 141 mmol/L (ref 134–144)
Total Protein: 6.7 g/dL (ref 6.0–8.5)
Triglycerides: 106 mg/dL (ref 0–149)
Uric Acid: 3.2 mg/dL (ref 2.5–7.1)
VLDL Cholesterol Cal: 21 mg/dL (ref 5–40)
WBC: 4.6 10*3/uL (ref 3.4–10.8)

## 2019-06-14 LAB — URINALYSIS, ROUTINE W REFLEX MICROSCOPIC
Bilirubin, UA: NEGATIVE
Glucose, UA: NEGATIVE
Ketones, UA: NEGATIVE
Leukocytes,UA: NEGATIVE
Nitrite, UA: NEGATIVE
Protein,UA: NEGATIVE
Specific Gravity, UA: 1.013 (ref 1.005–1.030)
Urobilinogen, Ur: 0.2 mg/dL (ref 0.2–1.0)
pH, UA: 5.5 (ref 5.0–7.5)

## 2019-06-14 LAB — MICROALBUMIN, URINE: Microalbumin, Urine: <3 ug/mL

## 2019-06-30 ENCOUNTER — Other Ambulatory Visit: Payer: Self-pay

## 2019-06-30 ENCOUNTER — Encounter: Payer: Self-pay | Admitting: Nurse Practitioner

## 2019-06-30 ENCOUNTER — Ambulatory Visit: Payer: Self-pay | Admitting: Nurse Practitioner

## 2019-06-30 VITALS — BP 126/77 | HR 60 | Temp 98.1°F | Resp 16 | Ht 66.0 in | Wt 157.0 lb

## 2019-06-30 DIAGNOSIS — I10 Essential (primary) hypertension: Secondary | ICD-10-CM

## 2019-06-30 DIAGNOSIS — R202 Paresthesia of skin: Secondary | ICD-10-CM

## 2019-06-30 DIAGNOSIS — Z23 Encounter for immunization: Secondary | ICD-10-CM

## 2019-06-30 DIAGNOSIS — Z1231 Encounter for screening mammogram for malignant neoplasm of breast: Secondary | ICD-10-CM

## 2019-06-30 DIAGNOSIS — R899 Unspecified abnormal finding in specimens from other organs, systems and tissues: Secondary | ICD-10-CM

## 2019-06-30 DIAGNOSIS — R3121 Asymptomatic microscopic hematuria: Secondary | ICD-10-CM

## 2019-06-30 DIAGNOSIS — M7711 Lateral epicondylitis, right elbow: Secondary | ICD-10-CM

## 2019-06-30 DIAGNOSIS — Z0001 Encounter for general adult medical examination with abnormal findings: Secondary | ICD-10-CM

## 2019-06-30 DIAGNOSIS — R7402 Elevation of levels of lactic acid dehydrogenase (LDH): Secondary | ICD-10-CM

## 2019-06-30 MED ORDER — HYDROCHLOROTHIAZIDE 25 MG PO TABS: 25.0000 mg | ORAL_TABLET | Freq: Every day | ORAL | 1 refills | Status: DC

## 2019-06-30 MED ORDER — SHINGRIX 50 MCG/0.5ML IM SUSR: 0.5000 mL | Freq: Once | INTRAMUSCULAR | 0 refills | Status: AC

## 2019-06-30 NOTE — Patient Instructions (Addendum)
Healthy eating habits and exercise as tolerated Remember to get eyes checked every 2 years Your mammogram is due 08/2018 and the order has been sent The shingrix vaccine has been sent to your local pharmacy; get flu vaccine this upcoming season Referring you to [facility] for left knee and right elbow Consider rest, ibuprofen with food as directed for right elbow Return to the clinic in 6 months for HTN f/u and will do EKG at that time since we were unable to do today  Health Maintenance, Female Adopting a healthy lifestyle and getting preventive care are important in promoting health and wellness. Ask your health care provider about:  The right schedule for you to have regular tests and exams.  Things you can do on your own to prevent diseases and keep yourself healthy. What should I know about diet, weight, and exercise? Eat a healthy diet    Eat a diet that includes plenty of vegetables, fruits, low-fat dairy products, and lean protein.  Do not eat a lot of foods that are high in solid fats, added sugars, or sodium. Maintain a healthy weight Body mass index (BMI) is used to identify weight problems. It estimates body fat based on height and weight. Your health care provider can help determine your BMI and help you achieve or maintain a healthy weight. Get regular exercise Get regular exercise. This is one of the most important things you can do for your health. Most adults should:  Exercise for at least 150 minutes each week. The exercise should increase your heart rate and make you sweat (moderate-intensity exercise).  Do strengthening exercises at least twice a week. This is in addition to the moderate-intensity exercise.  Spend less time sitting. Even light physical activity can be beneficial. Watch cholesterol and blood lipids Have your blood tested for lipids and cholesterol at 53 years of age, then have this test every 5 years. Have your cholesterol levels checked more  often if:  Your lipid or cholesterol levels are high.  You are older than 53 years of age.  You are at high risk for heart disease. What should I know about cancer screening? Depending on your health history and family history, you may need to have cancer screening at various ages. This may include screening for:  Breast cancer.  Cervical cancer.  Colorectal cancer.  Skin cancer.  Lung cancer. What should I know about heart disease, diabetes, and high blood pressure? Blood pressure and heart disease  High blood pressure causes heart disease and increases the risk of stroke. This is more likely to develop in people who have high blood pressure readings, are of African descent, or are overweight.  Have your blood pressure checked: ? Every 3-5 years if you are 5-37 years of age. ? Every year if you are 22 years old or older. Diabetes Have regular diabetes screenings. This checks your fasting blood sugar level. Have the screening done:  Once every three years after age 26 if you are at a normal weight and have a low risk for diabetes.  More often and at a younger age if you are overweight or have a high risk for diabetes. What should I know about preventing infection? Hepatitis B If you have a higher risk for hepatitis B, you should be screened for this virus. Talk with your health care provider to find out if you are at risk for hepatitis B infection. Hepatitis C Testing is recommended for:  Everyone born from [birth year] through 1965.  Anyone with known risk factors for hepatitis C. Sexually transmitted infections (STIs)  Get screened for STIs, including gonorrhea and chlamydia, if: ? You are sexually active and are younger than 53 years of age. ? You are older than 53 years of age and your health care provider tells you that you are at risk for this type of infection. ? Your sexual activity has changed since you were last screened, and you are at increased risk for chlamydia  or gonorrhea. Ask your health care provider if you are at risk.  Ask your health care provider about whether you are at high risk for HIV. Your health care provider may recommend a prescription medicine to help prevent HIV infection. If you choose to take medicine to prevent HIV, you should first get tested for HIV. You should then be tested every 3 months for as long as you are taking the medicine. Pregnancy  If you are about to stop having your period (premenopausal) and you may become pregnant, seek counseling before you get pregnant.  Take 400 to 800 micrograms (mcg) of folic acid every day if you become pregnant.  Ask for birth control (contraception) if you want to prevent pregnancy. Osteoporosis and menopause Osteoporosis is a disease in which the bones lose minerals and strength with aging. This can result in bone fractures. If you are 53 years old or older, or if you are at risk for osteoporosis and fractures, ask your health care provider if you should:  Be screened for bone loss.  Take a calcium or vitamin D supplement to lower your risk of fractures.  Be given hormone replacement therapy (HRT) to treat symptoms of menopause. Follow these instructions at home: Lifestyle  Do not use any products that contain nicotine or tobacco, such as cigarettes, e-cigarettes, and chewing tobacco. If you need help quitting, ask your health care provider.  Do not use street drugs.  Do not share needles.  Ask your health care provider for help if you need support or information about quitting drugs. Alcohol use  Do not drink alcohol if: ? Your health care provider tells you not to drink. ? You are pregnant, may be pregnant, or are planning to become pregnant.  If you drink alcohol: ? Limit how much you use to 0-1 drink a day. ? Limit intake if you are breastfeeding.  Be aware of how much alcohol is in your drink. In the [place] one drink equals one 12 oz bottle of beer (355 mL), one 5 oz  glass of wine (148 mL), or one 1 oz glass of hard liquor (44 mL). General instructions  Schedule regular health, dental, and eye exams.  Stay current with your vaccines.  Tell your health care provider if: ? You often feel depressed. ? You have ever been abused or do not feel safe at home. Summary  Adopting a healthy lifestyle and getting preventive care are important in promoting health and wellness.  Follow your health care provider's instructions about healthy diet, exercising, and getting tested or screened for diseases.  Follow your health care provider's instructions on monitoring your cholesterol and blood pressure. This information is not intended to replace advice given to you by your health care provider. Make sure you discuss any questions you have with your health care provider. Document Released: 05/11/2011 Document Revised: 10/19/2018 Document Reviewed: 10/19/2018 Elsevier Patient Education  2020 [employer]

## 2019-06-30 NOTE — Progress Notes (Signed)
Subjective:    Patient ID: Natalie Vargas, female    DOB: 1966-04-06, 53 y.o.   MRN: [id number]  HPI Natalie Vargas is here today for her CPE. She take HCTZ for HTN. She reports she takes meds as directed and tolerated along with other supplements.  Concerned of pain that has returned to right shoulder/elbow that has improved when being out of office and has worsened in the last week since returning on campus. She reports her job does entail a lot of typing and feels this contributes to right arm pain and ongoing but intermittant. She denies any chest pain, flank pain, blood in stool or other symptoms today except MSK. Request refill on HCTZ and admits has not missed any doses despite RF history.  Labs discussed and reviewed today and will do additional labs for further eval  Mammogram image reviewed and last 2019, reports last eye exam 2019, colonoscopy 2019, UTD on influenza/Tdap  She denies any FH of heart attack or stroke Review of Systems  Constitutional: Negative for fatigue and fever.  HENT: Negative for congestion and hearing loss.   Eyes: Negative for visual disturbance.  Respiratory: Negative for cough, chest tightness and shortness of breath.   Cardiovascular: Positive for chest pain. Negative for palpitations.  Gastrointestinal: Negative for abdominal pain, diarrhea and vomiting.  Genitourinary: Negative for hematuria.  Musculoskeletal: Positive for arthralgias. Negative for back pain, gait problem and joint swelling.  Skin: Negative for rash and wound.  Neurological: Negative for dizziness and weakness.  Psychiatric/Behavioral: Negative for agitation.       Objective:   Physical Exam Constitutional:      General: She is not in acute distress.    Appearance: Normal appearance. She is not ill-appearing.  HENT:     Head: Normocephalic and atraumatic.     Right Ear: Tympanic membrane and ear canal normal.     Left Ear: Tympanic membrane and ear canal normal.     Nose: Nose  normal.     Mouth/Throat:     Mouth: Mucous membranes are moist.     Pharynx: Oropharynx is clear. No oropharyngeal exudate or posterior oropharyngeal erythema.  Eyes:     General: No scleral icterus.       Right eye: No discharge.        Left eye: No discharge.     Extraocular Movements: Extraocular movements intact.     Pupils: Pupils are equal, round, and reactive to light.  Neck:     Musculoskeletal: Normal range of motion and neck supple. No neck rigidity or muscular tenderness.  Cardiovascular:     Rate and Rhythm: Normal rate and regular rhythm.     Pulses: Normal pulses.     Heart sounds: Normal heart sounds. No murmur.  Pulmonary:     Effort: Pulmonary effort is normal. No respiratory distress.     Breath sounds: Normal breath sounds. No wheezing.  Abdominal:     General: Abdomen is flat. Bowel sounds are normal.     Palpations: Abdomen is soft.     Tenderness: There is no abdominal tenderness.  Musculoskeletal: Normal range of motion.        General: Tenderness present. No swelling or signs of injury.     Right lower leg: No edema.     Left lower leg: No edema.     Comments: FROM to all extremities. Strength 5/5.  Right lateral epicondyle region with tenderness on palpation. No edema or discoloration discoloration Bilateral patella with 2+  reflex, sensory intact, ROM, no edema. Some crepitus > right. No tenderness on palpation  Skin:    General: Skin is warm and dry.     Capillary Refill: Capillary refill takes less than 2 seconds.     Findings: No bruising or erythema.  Neurological:     General: No focal deficit present.     Mental Status: She is alert and oriented to person, place, and time.     Comments: CNII-XII grossly intact  Psychiatric:        Mood and Affect: Mood normal.        Behavior: Behavior normal.           Assessment & Plan:

## 2019-07-03 LAB — POCT URINALYSIS DIPSTICK
Appearance: NORMAL
Bilirubin, UA: NEGATIVE
Blood, UA: NEGATIVE
Glucose, UA: NEGATIVE
Ketones, UA: NEGATIVE
Leukocytes, UA: NEGATIVE
Nitrite, UA: NEGATIVE
Protein, UA: NEGATIVE
Spec Grav, UA: 1.025 (ref 1.010–1.025)
Urobilinogen, UA: 0.2 E.U./dL
pH, UA: 6 (ref 5.0–8.0)

## 2019-07-03 LAB — LACTATE DEHYDROGENASE, ISOENZYMES
(LD) Fraction 1: 24 % (ref 17–32)
(LD) Fraction 2: 35 % (ref 25–40)
(LD) Fraction 3: 21 % (ref 17–27)
(LD) Fraction 4: 9 % (ref 5–13)
(LD) Fraction 5: 11 % (ref 4–20)
LDH: 186 IU/L (ref 119–226)

## 2019-07-12 ENCOUNTER — Encounter: Payer: Self-pay | Admitting: Nurse Practitioner

## 2019-08-02 ENCOUNTER — Ambulatory Visit: Payer: Self-pay

## 2019-08-02 ENCOUNTER — Other Ambulatory Visit: Payer: Self-pay

## 2019-08-02 DIAGNOSIS — Z23 Encounter for immunization: Secondary | ICD-10-CM

## 2019-09-04 ENCOUNTER — Ambulatory Visit
Admission: RE | Admit: 2019-09-04 | Discharge: 2019-09-04 | Disposition: A | Discharge status: Home / Self Care | Payer: BC Managed Care – PPO | Source: Ambulatory Visit | Attending: Nurse Practitioner | Admitting: Nurse Practitioner

## 2019-09-04 ENCOUNTER — Other Ambulatory Visit: Payer: Self-pay | Admitting: Nurse Practitioner

## 2019-09-04 DIAGNOSIS — Z1231 Encounter for screening mammogram for malignant neoplasm of breast: Secondary | ICD-10-CM | POA: Diagnosis not present

## 2019-09-04 DIAGNOSIS — R928 Other abnormal and inconclusive findings on diagnostic imaging of breast: Secondary | ICD-10-CM

## 2019-09-04 DIAGNOSIS — N6310 Unspecified lump in the right breast, unspecified quadrant: Secondary | ICD-10-CM

## 2019-09-19 ENCOUNTER — Ambulatory Visit
Admission: RE | Admit: 2019-09-19 | Discharge: 2019-09-19 | Disposition: A | Discharge status: Home / Self Care | Payer: BC Managed Care – PPO | Source: Ambulatory Visit | Attending: Nurse Practitioner | Admitting: Nurse Practitioner

## 2019-09-19 DIAGNOSIS — N6310 Unspecified lump in the right breast, unspecified quadrant: Secondary | ICD-10-CM | POA: Insufficient documentation

## 2019-09-19 DIAGNOSIS — R928 Other abnormal and inconclusive findings on diagnostic imaging of breast: Secondary | ICD-10-CM | POA: Insufficient documentation

## 2019-09-19 HISTORY — DX: Other specified disorders of muscle: M62.89

## 2019-12-21 ENCOUNTER — Ambulatory Visit: Payer: Self-pay | Admitting: Nurse Practitioner

## 2019-12-28 ENCOUNTER — Ambulatory Visit: Payer: Self-pay | Admitting: Nurse Practitioner

## 2020-01-05 ENCOUNTER — Ambulatory Visit: Payer: Self-pay | Admitting: Nurse Practitioner

## 2020-01-05 ENCOUNTER — Other Ambulatory Visit: Payer: Self-pay

## 2020-01-05 ENCOUNTER — Encounter: Payer: Self-pay | Admitting: Nurse Practitioner

## 2020-01-05 VITALS — BP 130/82 | HR 82 | Temp 98.4°F | Resp 18 | Ht 66.0 in | Wt 166.0 lb

## 2020-01-05 DIAGNOSIS — I10 Essential (primary) hypertension: Secondary | ICD-10-CM

## 2020-01-05 DIAGNOSIS — N926 Irregular menstruation, unspecified: Secondary | ICD-10-CM

## 2020-01-05 DIAGNOSIS — Z6826 Body mass index (BMI) 26.0-26.9, adult: Secondary | ICD-10-CM

## 2020-01-05 DIAGNOSIS — E7800 Pure hypercholesterolemia, unspecified: Secondary | ICD-10-CM

## 2020-01-05 DIAGNOSIS — R9431 Abnormal electrocardiogram [ECG] [EKG]: Secondary | ICD-10-CM

## 2020-01-05 MED ORDER — HYDROCHLOROTHIAZIDE 25 MG PO TABS: 25.0000 mg | ORAL_TABLET | Freq: Every day | ORAL | 1 refills | Status: DC

## 2020-01-05 NOTE — Progress Notes (Signed)
   Subjective:    Patient ID: Natalie Vargas, female    DOB: [birth year], 54 y.o.   MRN: [id number]  HPI Devin is here today for 6 month f/u on HTN and elevated LDL. She works in Consulting civil engineer at [employer]. She's been doing well and has no complaints today. She reports she's taking HCTZ as directed and tolerating. She has never had an EKG and office was not able to perform this during CPE and was done today. She denies any dizziness, c/p, SOB, blurred vision, headaches or other concerns at this time. Discussed last mammogram results with find of benign cyst. She denies any FH of cancer or CV disease. Discussed EKG reports today and will refer to cardiology for eval and she agrees to plan.  She does report her eating habits have not been that great since Covid but she continues to workout. She is excited about possibly going to [place]. 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  She reports that in the last 2 months her cycles have been more irregular from every 28 days to every 24.    Review of Systems  Constitutional: Negative for fever.  Eyes: Negative for visual disturbance.  Respiratory: Negative for shortness of breath.   Cardiovascular: Negative for chest pain and leg swelling.  Neurological: Negative for dizziness and headaches.       Objective:   Physical Exam Constitutional:      General: She is not in acute distress.    Appearance: Normal appearance. She is not ill-appearing.  HENT:     Head: Normocephalic and atraumatic.  Neck:     Vascular: No carotid bruit.  Cardiovascular:     Rate and Rhythm: Normal rate and regular rhythm.     Pulses: Normal pulses.     Heart sounds: Normal heart sounds. No murmur.     Comments: Bilateral radials and post. tibs 2+, no lower ext. edema Pulmonary:     Effort: Pulmonary effort is normal.     Breath sounds: Normal breath sounds. No wheezing.  Abdominal:     General: Abdomen is flat. Bowel sounds are normal.  Skin:    Capillary Refill: Capillary refill takes less than 2 seconds.  Neurological:     Mental Status: She is alert and oriented to person, place, and time.     Comments: CNII-XII grossly intact  Psychiatric:        Mood and Affect: Mood normal.           Assessment & Plan:

## 2020-01-05 NOTE — Patient Instructions (Addendum)
Natalie Vargas I will be referring you to cardiology for your EKG we discussed today We can do a hormone panel at your next office visit/physical if your cycles continue to be irregular if you would like Continue your current medication regimen and try to get back to healthier eating habits. Continue exercise as tolerated I have sent your refill to [facility] on [address] Return to the clinic in 6 months for your physical with fasting last or as needed

## 2020-01-11 ENCOUNTER — Other Ambulatory Visit: Payer: Self-pay

## 2020-01-11 NOTE — Addendum Note (Signed)
Addended by: Tresea Mall on: 01/11/2020 09:17 AM   Modules accepted: Orders

## 2020-01-12 ENCOUNTER — Encounter: Payer: Self-pay | Admitting: Cardiology

## 2020-01-12 ENCOUNTER — Ambulatory Visit: Payer: BC Managed Care – PPO | Admitting: Cardiology

## 2020-01-12 ENCOUNTER — Other Ambulatory Visit: Payer: Self-pay

## 2020-01-12 VITALS — BP 130/82 | HR 80 | Ht 66.0 in | Wt 165.0 lb

## 2020-01-12 DIAGNOSIS — I10 Essential (primary) hypertension: Secondary | ICD-10-CM

## 2020-01-12 DIAGNOSIS — R9431 Abnormal electrocardiogram [ECG] [EKG]: Secondary | ICD-10-CM | POA: Diagnosis not present

## 2020-01-12 NOTE — Patient Instructions (Signed)
Medication Instructions:  Your physician recommends that you continue on your current medications as directed. Please refer to the Current Medication list given to you today.  *If you need a refill on your cardiac medications before your next appointment, please call your pharmacy*   Lab Work: None If you have labs (blood work) drawn today and your tests are completely normal, you will receive your results only by: Marland Kitchen MyChart Message (if you have MyChart) OR . A paper copy in the mail If you have any lab test that is abnormal or we need to change your treatment, we will call you to review the results.   Testing/Procedures: None   Follow-Up: At [facility], you and your health needs are our priority.  As part of our continuing mission to provide you with exceptional heart care, we have created designated Provider Care Teams.  These Care Teams include your primary Cardiologist (physician) and Advanced Practice Providers (APPs -  Physician Assistants and Nurse Practitioners) who all work together to provide you with the care you need, when you need it.  We recommend signing up for the patient portal called "MyChart".  Sign up information is provided on this After Visit Summary.  MyChart is used to connect with patients for Virtual Visits (Telemedicine).  Patients are able to view lab/test results, encounter notes, upcoming appointments, etc.  Non-urgent messages can be sent to your provider as well.   To learn more about what you can do with MyChart, go to [email or web address].    Your next appointment:   As needed   The format for your next appointment:   In Person  Provider:    You may see Debbe Odea, MD or one of the following Advanced Practice Providers on your designated Care Team:    Nicolasa Ducking, NP  Eula Listen, PA-C  Marisue Ivan, PA-C

## 2020-01-12 NOTE — Progress Notes (Signed)
Cardiology Office Note:    Date:  01/12/2020   ID:  Natalie Vargas, DOB [birth year], MRN [id number]  PCP:  Maury Dus, NP  Cardiologist:  Kate Sable, MD  Electrophysiologist:  None   Referring MD: Maury Dus, NP   Chief Complaint  Patient presents with  . New Patient (Initial Visit)    Abnormal EKG; Meds verbally reviewed with patient.   Natalie Vargas is a 54 y.o. female who is being seen today for the evaluation of abnormal EKG at the request of Maury Dus, NP.   History of Present Illness:    Natalie Vargas is a 54 y.o. female with a hx of hypertension, hyperlipidemia who presents due to abnormal EKG. the patient saw her primary care provider for regular visit.  Her EKG was noted to be abnormal.  She denies any symptoms of chest pain, shortness of breath at rest or with exertion.  Denies palpitations denies orthopnea, edema, syncope or presyncope.  Denies any family history of heart disease.  She takes all her BP meds as prescribed.  She has a history of hyperlipidemia which is managed with diet.  I reviewed the chart to see what exactly was abnormal on prior EKG but ECG was not scanned.  We will try to contact primary care physician's office but could not reach anyone.  Past Medical History:  Diagnosis Date  . Allergy   . 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 Hyperlipidemia   . Hypertension   . Pelvic floor dysfunction     Past Surgical History:  Procedure Laterality Date  . CESAREAN SECTION  1994  . COLONOSCOPY WITH PROPOFOL N/A 06/23/2018   Procedure: COLONOSCOPY WITH PROPOFOL;  Surgeon: Jonathon Bellows, MD;  Location: [facility];  Service: Gastroenterology;  Laterality: N/A;    Current Medications: Current Meds  Medication Sig  . Calcium Carbonate-Vitamin D 600-400 MG-UNIT tablet Take 1 tablet by mouth daily.   . Cholecalciferol (VITAMIN D PO) Take 1,000 mg by mouth 2 (two) times daily.  .  hydrochlorothiazide (HYDRODIURIL) 25 MG tablet Take 1 tablet (25 mg total) by mouth daily.  . Omega-3 Fatty Acids (FISH OIL) 1000 MG CAPS Take 1,000 mg by mouth daily.     Allergies:   Penicillins, Sulfa antibiotics, and Lisinopril   Social History   Socioeconomic History  . Marital status: Married    Spouse name: Not on file  . Number of children: Not on file  . Years of education: Not on file  . Highest education level: Not on file  Occupational History  . Not on file  Tobacco Use  . Smoking status: Never Smoker  . Smokeless tobacco: Never Used  Substance and Sexual Activity  . Alcohol use: Yes    Comment: occassionally  . Drug use: No  . Sexual activity: Yes    Birth control/protection: Condom    Comment: Vasectomy   Other Topics Concern  . Not on file  Social History Narrative  . Not on file   Social Determinants of Health   Financial Resource Strain:   . Difficulty of Paying Living Expenses: Not on file  Food Insecurity:   . Worried About Charity fundraiser in the Last Year: Not on file  . Ran Out of Food in the Last Year: Not on file  Transportation Needs:   . Lack of Transportation (Medical): Not on file  . Lack of Transportation (Non-Medical): Not on file  Physical  Activity:   . Days of Exercise per Week: Not on file  . Minutes of Exercise per Session: Not on file  Stress:   . Feeling of Stress : Not on file  Social Connections:   . Frequency of Communication with Friends and Family: Not on file  . Frequency of Social Gatherings with Friends and Family: Not on file  . Attends Religious Services: Not on file  . Active Member of Clubs or Organizations: Not on file  . Attends Banker Meetings: Not on file  . Marital Status: Not on file     Family History: The patient's family history includes Cancer in her father; Congestive Heart Failure in her father; Hyperlipidemia in her father and mother; Hypertension in her mother; Skin cancer in her  father; Stroke in her paternal aunt. There is no history of Breast cancer.  ROS:   Please see the history of present illness.     All other systems reviewed and are negative.  EKGs/Labs/Other Studies Reviewed:    The following studies were reviewed today:   EKG:  EKG is  ordered today.  The ekg ordered today demonstrates normal sinus rhythm, antero-lateral and inferior T wave inversions noted.  Recent Labs: 06/13/2019: ALT 10; BUN 13; Creatinine, Ser 1.00; Hemoglobin 13.1; Platelets 296; Potassium 4.3; Sodium 141  Recent Lipid Panel    Component Value Date/Time   CHOL 198 06/13/2019 0802   TRIG 106 06/13/2019 0802   HDL 60 06/13/2019 0802   CHOLHDL 3.3 06/13/2019 0802   LDLCALC 117 (H) 06/13/2019 0802    Physical Exam:    VS:  BP 130/82 (BP Location: Right Arm, Patient Position: Sitting, Cuff Size: Normal)   Pulse 80   Ht 5\' 6"  (1.676 m)   Wt 165 lb (74.8 kg)   SpO2 98%   BMI 26.63 kg/m     Wt Readings from Last 3 Encounters:  01/12/20 165 lb (74.8 kg)  01/05/20 166 lb (75.3 kg)  06/30/19 157 lb (71.2 kg)     GEN:  Well nourished, well developed in no acute distress HEENT: Normal NECK: No JVD; No carotid bruits LYMPHATICS: No lymphadenopathy CARDIAC: RRR, no murmurs, rubs, gallops RESPIRATORY:  Clear to auscultation without rales, wheezing or rhonchi  ABDOMEN: Soft, non-tender, non-distended MUSCULOSKELETAL:  No edema; No deformity  SKIN: Warm and dry NEUROLOGIC:  Alert and oriented x 3 PSYCHIATRIC:  Normal affect   ASSESSMENT:    1. Nonspecific abnormal electrocardiogram (ECG) (EKG)   2. Essential hypertension    PLAN:    In order of problems listed above:  1. Patient with inferior and anterior lateral T wave inversions.  She denies any cardiac symptoms of chest pain or shortness of breath at rest or with exertion.  Denies any previous cardiac disease.  At this point there is no indication for further testing as she has no symptoms.  Primary prevention  with risk factor modification including adequate blood pressure control and cholesterol control recommended at this point.  We will try to get prior EKG obtained from the primary care physician's office.  If any significant change or arrhythmias noted on the EKG we will address it and call patient. 2. History of hypertension.  Blood pressure is adequately controlled.  Continue current BP meds.  Follow-up as needed  This note was generated in part or whole with voice recognition software. Voice recognition is usually quite accurate but there are transcription errors that can and very often do occur. I apologize for  any typographical errors that were not detected and corrected.  Medication Adjustments/Labs and Tests Ordered: Current medicines are reviewed at length with the patient today.  Concerns regarding medicines are outlined above.  Orders Placed This Encounter  Procedures  . EKG 12-Lead   No orders of the defined types were placed in this encounter.   Patient Instructions  Medication Instructions:  Your physician recommends that you continue on your current medications as directed. Please refer to the Current Medication list given to you today.  *If you need a refill on your cardiac medications before your next appointment, please call your pharmacy*   Lab Work: None If you have labs (blood work) drawn today and your tests are completely normal, you will receive your results only by: Marland Kitchen MyChart Message (if you have MyChart) OR . A paper copy in the mail If you have any lab test that is abnormal or we need to change your treatment, we will call you to review the results.   Testing/Procedures: None   Follow-Up: At [facility], you and your health needs are our priority.  As part of our continuing mission to provide you with exceptional heart care, we have created designated Provider Care Teams.  These Care Teams include your primary Cardiologist (physician) and Advanced  Practice Providers (APPs -  Physician Assistants and Nurse Practitioners) who all work together to provide you with the care you need, when you need it.  We recommend signing up for the patient portal called "MyChart".  Sign up information is provided on this After Visit Summary.  MyChart is used to connect with patients for Virtual Visits (Telemedicine).  Patients are able to view lab/test results, encounter notes, upcoming appointments, etc.  Non-urgent messages can be sent to your provider as well.   To learn more about what you can do with MyChart, go to [email or web address].    Your next appointment:   As needed   The format for your next appointment:   In Person  Provider:    You may see Debbe Odea, MD or one of the following Advanced Practice Providers on your designated Care Team:    Nicolasa Ducking, NP  Eula Listen, PA-C  Marisue Ivan, PA-C       Signed, Debbe Odea, MD  01/12/2020 4:57 PM    [facility]

## 2020-01-15 ENCOUNTER — Telehealth: Payer: Self-pay | Admitting: Cardiology

## 2020-01-15 NOTE — Telephone Encounter (Signed)
Faxed request for record to [phone]

## 2020-01-15 NOTE — Telephone Encounter (Signed)
Paper copy of EKG was received and it is now uploaded into Epic.

## 2020-01-15 NOTE — Telephone Encounter (Signed)
-----   Message from Felecia Jan, RN sent at 01/12/2020  3:50 PM EST ----- Regarding: Request previous EKG Patient had an EKG on 01-11-20 at [facility] center. She was seen today for abnormal EKG. Please contact them to request a copy of the EKG for Dr Sharlet Salina to review. Thanks

## 2020-01-17 ENCOUNTER — Encounter: Payer: Self-pay | Admitting: Medical

## 2020-01-17 ENCOUNTER — Ambulatory Visit: Payer: Self-pay | Admitting: Medical

## 2020-01-17 ENCOUNTER — Other Ambulatory Visit: Payer: Self-pay

## 2020-01-17 VITALS — BP 143/92 | HR 86 | Resp 16 | Wt 165.2 lb

## 2020-01-17 DIAGNOSIS — R21 Rash and other nonspecific skin eruption: Secondary | ICD-10-CM

## 2020-01-17 DIAGNOSIS — M62838 Other muscle spasm: Secondary | ICD-10-CM

## 2020-01-17 DIAGNOSIS — M79631 Pain in right forearm: Secondary | ICD-10-CM

## 2020-01-17 MED ORDER — CYCLOBENZAPRINE HCL 5 MG PO TABS: 5.0000 mg | ORAL_TABLET | Freq: Every day | ORAL | 0 refills | Status: DC

## 2020-01-17 MED ORDER — TRIAMCINOLONE ACETONIDE 0.1 % EX CREA: 1.0000 "application " | TOPICAL_CREAM | Freq: Two times a day (BID) | CUTANEOUS | 0 refills | Status: DC

## 2020-01-17 MED ORDER — ETODOLAC 500 MG PO TABS: 500.0000 mg | ORAL_TABLET | Freq: Two times a day (BID) | ORAL | 1 refills | Status: DC

## 2020-01-17 NOTE — Progress Notes (Signed)
Subjective:    Patient ID: Natalie Vargas, female    DOB: 04-17-1966, 54 y.o.   MRN: [id number]  HPI  54 yo female in non acute distress. 2 weeks ago had itching on right forearm ( posterior), then last week had neck stiffness into her arm and forearm, and then pain other outside of her elbow. Trouble sleeping last Tuesday.  Taking  Ibuprofen 400-600 every 4-6 hours.   numbness and  Tingling in  Right hand 2 and 3rd digits.  History of tendonitis in right forearm 2 years ago.   Right handed Works in Network engineer. .Blood pressure (!) 143/92, pulse 86, resp. rate 16, weight 165 lb 3.2 oz (74.9 kg), SpO2 100 %. Allergies  Allergen Reactions  . Penicillins Rash  . Sulfa Antibiotics Rash  . Lisinopril Cough    Review of Systems  Constitutional: Negative for chills and fever.  HENT: Negative for congestion (seasonal allergies.) and sore throat.   Respiratory: Positive for chest tightness (muscle tightness on right upper chest). Negative for cough and shortness of breath.   Cardiovascular: Negative for chest pain, palpitations and leg swelling.  Gastrointestinal: Negative for constipation, diarrhea and nausea.  Musculoskeletal: Positive for neck pain (with rotation to the right) and neck stiffness.   Weakness in grip x 2 yrs per patient.     Objective:   Physical Exam Vitals and nursing note reviewed.  Constitutional:      Appearance: Normal appearance. She is normal weight.  HENT:     Head: Normocephalic and atraumatic.  Eyes:     Extraocular Movements: Extraocular movements intact.     Conjunctiva/sclera: Conjunctivae normal.     Pupils: Pupils are equal, round, and reactive to light.  Neck:     Comments: No vertebral tenderness, tight on right side of trapezius. FROM those she states it is tight rotating head to the right. Musculoskeletal:        General: Tenderness present. No swelling, deformity or signs of injury. Normal range of motion.   Cervical back: Normal range of motion and neck supple. No rigidity or tenderness.  Lymphadenopathy:     Cervical: No cervical adenopathy.  Skin:    General: Skin is warm and dry.     Capillary Refill: Capillary refill takes less than 2 seconds.     Comments: No atrophy in hand muscles, 5/5 grip strength bilaterally. 5/5 strength of arms on extension / flexion , bilaterally. Tender with palpation proximal forearm extensors. No pain on elbow epicondyles.  Neurological:     General: No focal deficit present.     Mental Status: She is alert and oriented to person, place, and time.  Psychiatric:        Mood and Affect: Mood normal.        Behavior: Behavior normal.        Thought Content: Thought content normal.        Judgment: Judgment normal.      Rash on right forearm sporadic, tiny maculopapular areas approximately 7 pinpoint areas. No discharge noted.     Assessment & Plan:  Skin rash right forearm  ?  Radiculopathy. ( rash does not appear to look like shingles). Tendonitis of right forearm Neck pain right sided muscle spasm. Contact dermatitis right forearm. Warm and cool compresses to the neck area. Recommended brace for forearm tendonitis to get at pharmacy.   she received massage last Wednesday x 1 hour relief of pan on upper back and again neck.1/2  hour on Monday.  Meds ordered this encounter  Medications  . cyclobenzaprine (FLEXERIL) 5 MG tablet    Sig: Take 1 tablet (5 mg total) by mouth at bedtime.    Dispense:  21 tablet    Refill:  0  . etodolac (LODINE) 500 MG tablet    Sig: Take 1 tablet (500 mg total) by mouth 2 (two) times daily.    Dispense:  20 tablet    Refill:  1  . triamcinolone cream (KENALOG) 0.1 %    Sig: Apply 1 application topically 2 (two) times daily.    Dispense:  30 g    Refill:  0  follow up with [facility] as needed. Patient verbalizes understanding and has no questions at discharge.  Patient not safisfied due to EKG not being scanned  in time for her consult with cardiology.  She wanted to know why there was a delay. I will have [facility] call patient for follow up.

## 2020-01-17 NOTE — Patient Instructions (Signed)
Flexor Carpi Ulnaris and [employer]tronic Radialis Tendinitis Tendinitis is inflammation in the tissues that connect muscles to bone (tendons). This condition can affect any tendon. Two tendons in the forearm that can be affected by tendinitis are:  The flexor carpi ulnaris (FCU). This tendon is located on the pinkie side of the forearm.  The flexor carpi radialis (FCR). This tendon is located on the thumb side of the forearm. Tendinitis in either of these tendons will cause wrist or forearm pain. FCU and FCR tendinitis are often caused by overusing the forearm and wrist. What are the causes? Common causes of this condition include:  Repeated motions or overuse of the forearm and wrist.  Wear and tear from aging. Other causes include:  An injury.  Too much exercise or strain.  Certain antibiotic medicines. In some cases, the cause may not be known. What increases the risk? You are more likely to develop this condition if:  You play sports that involve constantly flexing or stretching the wrist and forearm, such as volleyball, golf, or tennis.  You are over 33 years of age.  You have certain health conditions, such as: ? Diabetes. ? Rheumatoid arthritis. ? Gout.  You have a job that involves flexing the wrist over and over, such as working as a Production designer, theatre/television/film, or Scientist, water quality. What are the signs or symptoms? Symptoms of this condition may develop gradually. Symptoms include:  Pain or tenderness in the wrist or forearm.  Pain when flexing or stretching the wrist or forearm.  Pain when gripping or lifting with the palm of the hand.  Swelling in the wrist or forearm. How is this diagnosed? This condition may be diagnosed based on:  Your symptoms.  Your medical history.  A physical exam. During the exam, you may be asked to move your hand, wrist, and arm in certain ways. To rule out other conditions, your health care provider may order tests, such as:  MRI. This test provides  detailed images of the body's soft tissues and detects tendon tears and inflammation.  Ultrasound. This test detects soft tissue injuries, such as tears and inflammation of the ligaments or tendons. How is this treated? This condition may be treated by:  Resting the injured area.  Applying heat or ice to the wrist or forearm to reduce pain and inflammation.  Wearing a splint to keep your wrist and forearm from moving (keep them immobilized) until your symptoms improve.  Taking medicine. Your health care provider may prescribe steroids or other anti-inflammatory medicines, like ibuprofen, to ease your pain and other symptoms.  Doing exercises to help you maintain movement and range of motion in your wrist (physical therapy).  Having injections of an anti-inflammatory medicine (steroid) if the other treatments are not helping.  Having surgery. This is only needed in severe cases. Follow these instructions at home: If you have a splint:  Wear it as told by your health care provider. Remove it only as told by your health care provider.  Loosen it if your fingers tingle, become numb, or turn cold and blue.  Keep it clean.  If the splint is not waterproof: ? Do not let it get wet. ? Cover it with a watertight covering when you take a bath or shower. Managing pain, stiffness, and swelling      If directed, put ice on the painful area. ? If you have a removable splint, remove it as told by your health care provider. ? Put ice in a plastic bag. ?  Place a towel between your skin and the bag. ? Leave the ice on for 20 minutes, 2-3 times a day.  If directed, apply heat to the affected area as often as told by your health care provider. Use the heat source that your health care provider recommends, such as a moist heat pack or a heating pad. ? Place a towel between your skin and the heat source. ? Leave the heat on for 20-30 minutes. ? Remove the heat if your skin turns bright red.  This is especially important if you are unable to feel pain, heat, or cold. You may have a greater risk of getting burned.  Move your fingers often to reduce stiffness and swelling.  Raise (elevate) the injured area above the level of your heart while you are sitting or lying down. Activity  Limit activities that cause your symptoms to get worse or flare up.  Do exercises as told by your health care provider.  Return to your normal activities as told by your health care provider. Ask your health care provider what activities are safe for you. General instructions  Take over-the-counter and prescription medicines only as told by your health care provider.  Do not use any products that contain nicotine or tobacco, such as cigarettes, e-cigarettes, and chewing tobacco. These can delay healing. If you need help quitting, ask your health care provider.  Keep all follow-up visits as told by your health care provider. This is important. How is this prevented?  Warm up and stretch before being active.  Cool down and stretch after being active.  Give your body time to rest between periods of activity.  Make sure to use equipment that fits you.  Be safe and responsible while being active. This will help you to avoid falls.  Maintain physical fitness, including: ? Strength. ? Flexibility. ? Cardiovascular fitness. ? Endurance. Contact a health care provider if:  Your pain does not improve.  Your pain gets worse. Get help right away if:  Your pain is severe.  You cannot move your wrist. Summary  Flexor carpi ulnaris (FCU) tendinitis and flexor carpi radialis (FCR) tendinitis are conditions that involve inflammation in tendons of the forearm.  These conditions cause pain or tenderness in the wrist or forearm.  Treatment may include resting the painful area, applying heat or ice, taking medicines, and doing exercises to help you maintain movement and range of motion in your  wrist (physical therapy).  If you have a splint, wear it as told by your health care provider. This information is not intended to replace advice given to you by your health care provider. Make sure you discuss any questions you have with your health care provider. Document Revised: 02/21/2019 Document Reviewed: 01/13/2019 Elsevier Patient Education  2020 [employer].

## 2020-05-07 ENCOUNTER — Encounter: Payer: Self-pay | Admitting: Nurse Practitioner

## 2020-05-07 ENCOUNTER — Telehealth: Payer: BC Managed Care – PPO | Admitting: Nurse Practitioner

## 2020-05-07 ENCOUNTER — Other Ambulatory Visit: Payer: Self-pay

## 2020-05-07 DIAGNOSIS — Z298 Encounter for other specified prophylactic measures: Secondary | ICD-10-CM

## 2020-05-07 NOTE — Patient Instructions (Addendum)
Natalie Vargas, we have discussed in length the Sulfa link to Diamox and therefore it's not safe that you take it. I currently recommend/prescribe Decadron 2mg  by mouth every 6 hours as needed (take 3 times a day) and you will continue 3 days upon descent/peak arrival Your current and best treatment options from our discussion and evidenced based research is Decodron. Please find the education below. Please let me know if you have any questions and how you would like to proceed. Side effects listed have happened to people, but please know they are not common     Dexamethasone tablets What is this medicine? DEXAMETHASONE (dex a METH a sone) is a corticosteroid. It is commonly used to treat inflammation of the skin, joints, lungs, and other organs. Common conditions treated include asthma, allergies, and arthritis. It is also used for other conditions, such as blood disorders and diseases of the adrenal glands. This medicine may be used for other purposes; ask your health care provider or pharmacist if you have questions. COMMON BRAND NAME(S): [employer]USHINGS [employer] DI[employer]NO[employer]IC, Decadron, Dexabliss, DexPak Jr TaperPak, DexPak TaperPak, Dxevo, Hemady, HiDex, TaperDex, ZCORT, Zema-Pak, ZoDex, ZonaCort 11 Day, ZonaCort 7 Day What should I tell my health care provider before I take this medicine? They need to know if you have any of these conditions:  Cushing's syndrome  diabetes  glaucoma  heart disease  high blood pressure  infection like herpes, measles, tuberculosis, or chickenpox  kidney disease  liver disease  mental illness  myasthenia gravis  osteoporosis  previous heart attack  seizures  stomach or intestine problems  thyroid disease  an unusual or allergic reaction to dexamethasone, corticosteroids, other medicines, lactose, foods, dyes, or preservatives  pregnant or trying to get pregnant  breast-feeding How should I use this medicine? Take this medicine by mouth with a drink  of water. Follow the directions on the prescription label. Take it with food or milk to avoid stomach upset. If you are taking this medicine once a day, take it in the morning. Do not take more medicine than you are told to take. Do not suddenly stop taking your medicine because you may develop a severe reaction. Your doctor will tell you how much medicine to take. If your doctor wants you to stop the medicine, the dose may be slowly lowered over time to avoid any side effects. Talk to your pediatrician regarding the use of this medicine in children. Special care may be needed. Patients over 83 years old may have a stronger reaction and need a smaller dose. Overdosage: If you think you have taken too much of this medicine contact a poison control center or emergency room at once. NOTE: This medicine is only for you. Do not share this medicine with others. What if I miss a dose? If you miss a dose, take it as soon as you can. If it is almost time for your next dose, talk to your doctor or health care professional. You may need to miss a dose or take an extra dose. Do not take double or extra doses without advice. What may interact with this medicine? Do not take this medicine with any of the following medications:  live virus vaccines This medicine may also interact with the following medications:  aminoglutethimide  amphotericin B  aspirin and aspirin-like medicines  certain antibiotics like erythromycin, clarithromycin, and troleandomycin  certain antivirals for HIV or hepatitis  certain medicines for seizures like carbamazepine, phenobarbital, phenytoin  certain medicines to treat myasthenia gravis  cholestyramine  cyclosporine  digoxin  diuretics  ephedrine  female hormones, like estrogen or progestins and birth control pills  insulin or other medicines for diabetes  isoniazid  ketoconazole  medicines that relax muscles for surgery  mifepristone  NSAIDs, medicines  for pain and inflammation, like ibuprofen or naproxen  rifampin  skin tests for allergies  thalidomide  vaccines  warfarin This list may not describe all possible interactions. Give your health care provider a list of all the medicines, herbs, non-prescription drugs, or dietary supplements you use. Also tell them if you smoke, drink alcohol, or use illegal drugs. Some items may interact with your medicine. What should I watch for while using this medicine? Visit your health care professional for regular checks on your progress. Tell your health care professional if your symptoms do not start to get better or if they get worse. Your condition will be monitored carefully while you are receiving this medicine. Wear a medical ID bracelet or chain. Carry a card that describes your disease and details of your medicine and dosage times. This medicine may increase your risk of getting an infection. Call your health care professional for advice if you get a fever, chills, or sore throat, or other symptoms of a cold or flu. Do not treat yourself. Try to avoid being around people who are sick. Call your health care professional if you are around anyone with measles, chickenpox, or if you develop sores or blisters that do not heal properly. If you are going to need surgery or other procedures, tell your doctor or health care professional that you have taken this medicine within the last 12 months. Ask your doctor or health care professional about your diet. You may need to lower the amount of salt you eat. This medicine may increase blood sugar. Ask your healthcare provider if changes in diet or medicines are needed if you have diabetes. What side effects may I notice from receiving this medicine? Side effects that you should report to your doctor or health care professional as soon as possible:  allergic reactions like skin rash, itching or hives, swelling of the face, lips, or tongue  bloody or black,  tarry stools  changes in emotions or moods  changes in vision  confusion, excitement, restlessness  depressed mood  eye pain  hallucinations  fever or chills, cough, sore throat, pain or difficulty passing urine  muscle weakness  severe or sudden stomach or belly pain  signs and symptoms of high blood sugar such as being more thirsty or hungry or having to urinate more than normal. You may also feel very tired or have blurry vision.  signs and symptoms of infection like fever; chills; cough; sore throat; pain or trouble passing urine  swelling of ankles, feet  unusual bruising or bleeding  wounds that do not heal Side effects that usually do not require medical attention (report to your doctor or health care professional if they continue or are bothersome):  increased appetite  increased growth of face or body hair  headache  nausea, vomiting  skin problems, acne, thin and shiny skin  trouble sleeping  weight gain This list may not describe all possible side effects. Call your doctor for medical advice about side effects. You may report side effects to FDA at 1-800-FDA-1088. Where should I keep my medicine? Keep out of the reach of children. Store at room temperature between 20 and 25 degrees C (68 and 77 degrees F). Protect from light. Throw away  any unused medicine after the expiration date. NOTE: This sheet is a summary. It may not cover all possible information. If you have questions about this medicine, talk to your doctor, pharmacist, or health care provider.  2020 Elsevier/Gold Standard (2019-05-09 14:23:34)

## 2020-05-07 NOTE — Progress Notes (Signed)
   Subjective:    Patient ID: Natalie Vargas, female    DOB: 1966/04/29, 54 y.o.   MRN: [id number]  HPI Natalie Vargas is on a virtual visit today with permission to treat. 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 She is planning to go to [place] 7/9 to prepare for the hike which she will be at 12K feet and [place] is about 14K. She walks 7mi/day and her [place] trip she thinks will be 8-10 mi/day. She's heard about Diamox and common side effects from it's use.   Review of Systems  Constitutional:       No concerns. Consultation for travel       Objective:   Physical Exam Constitutional:      General: She is not in acute distress.    Appearance: Normal appearance. She is not ill-appearing, toxic-appearing or diaphoretic.  HENT:     Head: Normocephalic and atraumatic.  Pulmonary:     Effort: Pulmonary effort is normal.  Neurological:     Mental Status: She is alert and oriented to person, place, and time.  Psychiatric:        Mood and Affect: Mood normal.        Behavior: Behavior normal.           Assessment & Plan:

## 2020-05-08 ENCOUNTER — Ambulatory Visit: Payer: Self-pay | Admitting: Nurse Practitioner

## 2020-06-20 ENCOUNTER — Telehealth: Payer: BC Managed Care – PPO | Admitting: Medical

## 2020-06-20 ENCOUNTER — Encounter: Payer: Self-pay | Admitting: Medical

## 2020-06-20 ENCOUNTER — Other Ambulatory Visit: Payer: Self-pay

## 2020-06-20 ENCOUNTER — Ambulatory Visit: Payer: BC Managed Care – PPO | Admitting: *Deleted

## 2020-06-20 DIAGNOSIS — R519 Headache, unspecified: Secondary | ICD-10-CM

## 2020-06-20 DIAGNOSIS — Z20822 Contact with and (suspected) exposure to covid-19: Secondary | ICD-10-CM

## 2020-06-20 DIAGNOSIS — R5383 Other fatigue: Secondary | ICD-10-CM

## 2020-06-20 LAB — POC COVID19 BINAXNOW: SARS Coronavirus 2 Ag: NEGATIVE

## 2020-06-20 NOTE — Progress Notes (Signed)
° °  Subjective:    Patient ID: Natalie Vargas, female    DOB: 10/18/1966, 54 y.o.   MRN: [id number]  HPI 54 yo female in non acute distress, consents to  Telemedicine appointment. She started with symptoms yesterday which included sinus pressure PND , HA, Fatigue, then cough overnight.  Took Advil and Robitussion for cough last  Dayquil  Mid-day with an Advil  Symptoms of fever, sinus headache and diarrhea occurred last Wed-Friday. Patient did a home test and it was negative. This week fatigue got worse.  Allergies  Allergen Reactions   Penicillins Rash   Sulfa Antibiotics Rash   Lisinopril Cough    Current Outpatient Medications:    Calcium Carbonate-Vitamin D 600-400 MG-UNIT tablet, Take 1 tablet by mouth daily. , Disp: , Rfl:    Cholecalciferol (VITAMIN D PO), Take 1,000 mg by mouth 2 (two) times daily., Disp: , Rfl:    Cholecalciferol (VITAMIN D3) 100000 UNIT/GM POWD, Take by mouth., Disp: , Rfl:    hydrochlorothiazide (HYDRODIURIL) 25 MG tablet, Take 1 tablet (25 mg total) by mouth daily., Disp: 90 tablet, Rfl: 1   Omega-3 Fatty Acids (FISH OIL) 1000 MG CAPS, Take 1,000 mg by mouth daily., Disp: , Rfl:    triamcinolone cream (KENALOG) 0.1 %, Apply 1 application topically 2 (two) times daily., Disp: 30 g, Rfl: 0   Review of Systems  Constitutional: Positive for fatigue. Negative for chills and fever.  HENT: Positive for congestion, postnasal drip, rhinorrhea, sinus pressure and sinus pain. Negative for ear pain, sneezing and sore throat.   Respiratory: Negative for cough, shortness of breath and wheezing.   Cardiovascular: Negative for chest pain.  Gastrointestinal: Negative for abdominal pain, diarrhea (diarrhea last Friday, last Wednesday  Sinus headache  with slight fever 99.4symptoms , pain and fatigue.), nausea and rectal pain.  Musculoskeletal: Positive for myalgias.  Skin: Negative for rash.  Neurological: Positive for headaches. Negative for dizziness,  tremors, seizures, syncope, weakness and light-headedness.      Negative Covid-19 home test.negative Objective:   Physical Exam Neurological:     Mental Status: She is alert and oriented to person, place, and time.  Psychiatric:        Mood and Affect: Mood normal.        Behavior: Behavior normal.        Thought Content: Thought content normal.        Judgment: Judgment normal.    No physical exam was performed due to telemedicine appointment.  POCT was Positve in clinc. The results below are not accurate Recent Results (from the past 2160 hour(s))  POC COVID-19     Status: Normal   Collection Time: 06/20/20  2:37 PM  Result Value Ref Range   SARS Coronavirus 2 Ag Negative Negative    Comment: Pt aware. PCR sent. Virtual visit completed with H.Fatimata Talsma PAC.        Assessment & Plan:  Covid-19 POCT Positive Covid PCR pending Isolate, reviewed cleaning techniques, she may want there husband to get tested due to close contact.  Patient verbalizes understanding and has no questions at the end of our conversation.

## 2020-06-22 LAB — SARS-COV-2, NAA 2 DAY TAT

## 2020-06-22 LAB — NOVEL CORONAVIRUS, NAA: SARS-CoV-2, NAA: DETECTED — AB

## 2020-06-23 ENCOUNTER — Telehealth: Payer: Self-pay | Admitting: Unknown Physician Specialty

## 2020-06-23 NOTE — Telephone Encounter (Signed)
Called to discuss with Natalie Vargas about Covid symptoms and the use of bamlanivimab, a monoclonal antibody infusion for those with mild to moderate Covid symptoms and at a high risk of hospitalization.     Pt does not qualify for infusion therapy as symptoms first presented > 7 days prior to timing of infusion. Symptoms tier reviewed as well as criteria for ending isolation. Preventative practices reviewed. 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

## 2020-06-24 ENCOUNTER — Telehealth: Payer: Self-pay | Admitting: Medical

## 2020-06-24 NOTE — Telephone Encounter (Signed)
06/20/2020.  Patient made aware that  PCR positive.   POCT had a very light line. Computer results POCT results could not be changed to posiitve, so it is in comments.  Reviewed [place] time with patient. Will sent Covid-19 FAQ from Epic. Patient verbalizes understanding and has no questions at the end of our conversation.

## 2020-07-04 ENCOUNTER — Ambulatory Visit: Payer: BC Managed Care – PPO | Admitting: Medical

## 2020-07-04 ENCOUNTER — Encounter: Payer: Self-pay | Admitting: Medical

## 2020-07-04 ENCOUNTER — Other Ambulatory Visit: Payer: Self-pay

## 2020-07-04 VITALS — BP 134/89 | HR 85 | Temp 97.3°F | Resp 16 | Ht 66.0 in | Wt 167.0 lb

## 2020-07-04 DIAGNOSIS — Z0000 Encounter for general adult medical examination without abnormal findings: Secondary | ICD-10-CM

## 2020-07-04 NOTE — Progress Notes (Signed)
   Subjective:    Patient ID: Natalie Vargas, female    DOB: 1966/09/30, 54 y.o.   MRN: [id number]  HPI 54 yo female in non acute distress, returns to clinic for 6 month check up, patient states it is for a blood pressure check.Marland Kitchen PCP was [facility], who has left the practice. Viviano Simas NP will be taking over patients primary care.  Patient is due for labs and annual physical.  Loss of smell, with Covid-19 diagnosed on  06/20/20. She  returned to work on 07/01/20. Patient also states her  taste is not as" keen". Does have a PND cough/clearing of throat history of seasonal allergies, taking no alleragy medications.  Patient concerned that HR did not contact her.   Blood pressure 134/89, pulse 85, temperature (!) 97.3 F (36.3 C), temperature source Temporal, resp. rate 16, height 5\' 6"  (1.676 m), weight 167 lb (75.8 kg), SpO2 99 %.   Allergies  Allergen Reactions  . Penicillins Rash  . Sulfa Antibiotics Rash  . Lisinopril Cough    Review of Systems  Constitutional: Positive for fatigue (mild). Negative for chills and fever.  HENT: Positive for congestion and postnasal drip. Negative for ear pain, rhinorrhea and sore throat.   Respiratory: Positive for cough (clear occasionally productive). Negative for chest tightness, shortness of breath and wheezing.   Cardiovascular: Negative for chest pain.  Gastrointestinal: Negative for abdominal pain, diarrhea, nausea and vomiting.  Musculoskeletal: Negative for myalgias.  Skin: Negative for rash.  Neurological: Positive for headaches (periodically ( her baseline)  may once in 30 days.). Negative for dizziness, syncope and light-headedness.  Hematological: Negative for adenopathy.   Covid-19 vaccinated with Pfizer March 30 was second dose.   currently perimenopausal    Objective:   Physical Exam Vitals and nursing note reviewed.  Constitutional:      Appearance: Normal appearance.  Eyes:     Extraocular Movements:  Extraocular movements intact.     Conjunctiva/sclera: Conjunctivae normal.     Pupils: Pupils are equal, round, and reactive to light.  Cardiovascular:     Rate and Rhythm: Normal rate and regular rhythm.  Pulmonary:     Effort: Pulmonary effort is normal.     Breath sounds: Normal breath sounds.  Neurological:     Mental Status: She is alert and oriented to person, place, and time.  Psychiatric:        Mood and Affect: Mood normal.        Behavior: Behavior normal.        Thought Content: Thought content normal.        Judgment: Judgment normal.    No cough or clearing of throat noted in room.       Assessment & Plan:  Blood pressure checked, will monitor. Given Dash Diet information. Annual Physical exam needed, to schedule in October. Annual lab work placed in Epic patient to schedule appointment today. Will contact patient if abnormalities. Return  In October for Annual  physical. . Traveling to [place] in Jan 2022 going to elevation of  19,000, she  cannot take Diamox, due to Sulfa allergy, I referred patient to [facility]. Seasonal allergies recommended OTC Zyrtec or Claritin or Allegra daily and  recommended OTC Flonase for PND and nasal congestion/ runny nose. Patient to contact HR about her Covid-19 concerns. Patient verbalizes understanding and has no questions at discharge.

## 2020-07-04 NOTE — Patient Instructions (Signed)
DASH Eating Plan DASH stands for "Dietary Approaches to Stop Hypertension." The DASH eating plan is a healthy eating plan that has been shown to reduce high blood pressure (hypertension). It may also reduce your risk for type 2 diabetes, heart disease, and stroke. The DASH eating plan may also help with weight loss. What are tips for following this plan?  General guidelines  Avoid eating more than 2,300 mg (milligrams) of salt (sodium) a day. If you have hypertension, you may need to reduce your sodium intake to 1,500 mg a day.  Limit alcohol intake to no more than 1 drink a day for nonpregnant women and 2 drinks a day for men. One drink equals 12 oz of beer, 5 oz of wine, or 1 oz of hard liquor.  Work with your health care provider to maintain a healthy body weight or to lose weight. Ask what an ideal weight is for you.  Get at least 30 minutes of exercise that causes your heart to beat faster (aerobic exercise) most days of the week. Activities may include walking, swimming, or biking.  Work with your health care provider or diet and nutrition specialist (dietitian) to adjust your eating plan to your individual calorie needs. Reading food labels   Check food labels for the amount of sodium per serving. Choose foods with less than 5 percent of the Daily Value of sodium. Generally, foods with less than 300 mg of sodium per serving fit into this eating plan.  To find whole grains, look for the word "whole" as the first word in the ingredient list. Shopping  Buy products labeled as "low-sodium" or "no salt added."  Buy fresh foods. Avoid canned foods and premade or frozen meals. Cooking  Avoid adding salt when cooking. Use salt-free seasonings or herbs instead of table salt or sea salt. Check with your health care provider or pharmacist before using salt substitutes.  Do not fry foods. Cook foods using healthy methods such as baking, boiling, grilling, and broiling instead.  Cook with  heart-healthy oils, such as olive, canola, soybean, or sunflower oil. Meal planning  Eat a balanced diet that includes: ? 5 or more servings of fruits and vegetables each day. At each meal, try to fill half of your plate with fruits and vegetables. ? Up to 6-8 servings of whole grains each day. ? Less than 6 oz of lean meat, poultry, or fish each day. A 3-oz serving of meat is about the same size as a deck of cards. One egg equals 1 oz. ? 2 servings of low-fat dairy each day. ? A serving of nuts, seeds, or beans 5 times each week. ? Heart-healthy fats. Healthy fats called Omega-3 fatty acids are found in foods such as flaxseeds and coldwater fish, like sardines, salmon, and mackerel.  Limit how much you eat of the following: ? Canned or prepackaged foods. ? Food that is high in trans fat, such as fried foods. ? Food that is high in saturated fat, such as fatty meat. ? Sweets, desserts, sugary drinks, and other foods with added sugar. ? Full-fat dairy products.  Do not salt foods before eating.  Try to eat at least 2 vegetarian meals each week.  Eat more home-cooked food and less restaurant, buffet, and fast food.  When eating at a restaurant, ask that your food be prepared with less salt or no salt, if possible. What foods are recommended? The items listed may not be a complete list. Talk with your dietitian about   what dietary choices are best for you. Grains Whole-grain or whole-wheat bread. Whole-grain or whole-wheat pasta. Brown rice. Oatmeal. Quinoa. Bulgur. Whole-grain and low-sodium cereals. Pita bread. Low-fat, low-sodium crackers. Whole-wheat flour tortillas. Vegetables Fresh or frozen vegetables (raw, steamed, roasted, or grilled). Low-sodium or reduced-sodium tomato and vegetable juice. Low-sodium or reduced-sodium tomato sauce and tomato paste. Low-sodium or reduced-sodium canned vegetables. Fruits All fresh, dried, or frozen fruit. Canned fruit in natural juice (without  added sugar). Meat and other protein foods Skinless chicken or turkey. Ground chicken or turkey. Pork with fat trimmed off. Fish and seafood. Egg whites. Dried beans, peas, or lentils. Unsalted nuts, nut butters, and seeds. Unsalted canned beans. Lean cuts of beef with fat trimmed off. Low-sodium, lean deli meat. Dairy Low-fat (1%) or fat-free (skim) milk. Fat-free, low-fat, or reduced-fat cheeses. Nonfat, low-sodium ricotta or cottage cheese. Low-fat or nonfat yogurt. Low-fat, low-sodium cheese. Fats and oils Soft margarine without trans fats. Vegetable oil. Low-fat, reduced-fat, or light mayonnaise and salad dressings (reduced-sodium). Canola, safflower, olive, soybean, and sunflower oils. Avocado. Seasoning and other foods Herbs. Spices. Seasoning mixes without salt. Unsalted popcorn and pretzels. Fat-free sweets. What foods are not recommended? The items listed may not be a complete list. Talk with your dietitian about what dietary choices are best for you. Grains Baked goods made with fat, such as croissants, muffins, or some breads. Dry pasta or rice meal packs. Vegetables Creamed or fried vegetables. Vegetables in a cheese sauce. Regular canned vegetables (not low-sodium or reduced-sodium). Regular canned tomato sauce and paste (not low-sodium or reduced-sodium). Regular tomato and vegetable juice (not low-sodium or reduced-sodium). Pickles. Olives. Fruits Canned fruit in a light or heavy syrup. Fried fruit. Fruit in cream or butter sauce. Meat and other protein foods Fatty cuts of meat. Ribs. Fried meat. Bacon. Sausage. Bologna and other processed lunch meats. Salami. Fatback. Hotdogs. Bratwurst. Salted nuts and seeds. Canned beans with added salt. Canned or smoked fish. Whole eggs or egg yolks. Chicken or turkey with skin. Dairy Whole or 2% milk, cream, and half-and-half. Whole or full-fat cream cheese. Whole-fat or sweetened yogurt. Full-fat cheese. Nondairy creamers. Whipped toppings.  Processed cheese and cheese spreads. Fats and oils Butter. Stick margarine. Lard. Shortening. Ghee. Bacon fat. Tropical oils, such as coconut, palm kernel, or palm oil. Seasoning and other foods Salted popcorn and pretzels. Onion salt, garlic salt, seasoned salt, table salt, and sea salt. Worcestershire sauce. Tartar sauce. Barbecue sauce. Teriyaki sauce. Soy sauce, including reduced-sodium. Steak sauce. Canned and packaged gravies. Fish sauce. Oyster sauce. Cocktail sauce. Horseradish that you find on the shelf. Ketchup. Mustard. Meat flavorings and tenderizers. Bouillon cubes. Hot sauce and Tabasco sauce. Premade or packaged marinades. Premade or packaged taco seasonings. Relishes. Regular salad dressings. Where to find more information:  National Heart, Lung, and Blood Institute: www.nhlbi.nih.gov  American Heart Association: www.heart.org Summary  The DASH eating plan is a healthy eating plan that has been shown to reduce high blood pressure (hypertension). It may also reduce your risk for type 2 diabetes, heart disease, and stroke.  With the DASH eating plan, you should limit salt (sodium) intake to 2,300 mg a day. If you have hypertension, you may need to reduce your sodium intake to 1,500 mg a day.  When on the DASH eating plan, aim to eat more fresh fruits and vegetables, whole grains, lean proteins, low-fat dairy, and heart-healthy fats.  Work with your health care provider or diet and nutrition specialist (dietitian) to adjust your eating plan to your   individual calorie needs. This information is not intended to replace advice given to you by your health care provider. Make sure you discuss any questions you have with your health care provider. Document Revised: 10/08/2017 Document Reviewed: 10/19/2016 Elsevier Patient Education  2020 Elsevier Inc.  

## 2020-07-23 ENCOUNTER — Other Ambulatory Visit: Payer: Self-pay

## 2020-07-23 ENCOUNTER — Other Ambulatory Visit: Payer: BC Managed Care – PPO

## 2020-07-23 DIAGNOSIS — Z0000 Encounter for general adult medical examination without abnormal findings: Secondary | ICD-10-CM

## 2020-07-24 LAB — CMP12+LP+TP+TSH+6AC+CBC/D/PLT
ALT: 14 IU/L (ref 0–32)
AST: 18 IU/L (ref 0–40)
Albumin/Globulin Ratio: 1.7 (ref 1.2–2.2)
Albumin: 4.7 g/dL (ref 3.8–4.9)
Alkaline Phosphatase: 98 IU/L (ref 44–121)
BUN/Creatinine Ratio: 13 (ref 9–23)
BUN: 12 mg/dL (ref 6–24)
Basophils Absolute: 0.1 10*3/uL (ref 0.0–0.2)
Basos: 1 %
Bilirubin Total: 0.3 mg/dL (ref 0.0–1.2)
Calcium: 10.2 mg/dL (ref 8.7–10.2)
Chloride: 99 mmol/L (ref 96–106)
Chol/HDL Ratio: 3.5 ratio (ref 0.0–4.4)
Cholesterol, Total: 229 mg/dL — ABNORMAL HIGH (ref 100–199)
Creatinine, Ser: 0.89 mg/dL (ref 0.57–1.00)
EOS (ABSOLUTE): 0.1 10*3/uL (ref 0.0–0.4)
Eos: 1 %
Estimated CHD Risk: 0.6 times avg. (ref 0.0–1.0)
Free Thyroxine Index: 1.8 (ref 1.2–4.9)
GFR calc Af Amer: 86 mL/min/{1.73_m2} (ref >59)
GFR calc non Af Amer: 74 mL/min/{1.73_m2} (ref >59)
GGT: 13 IU/L (ref 0–60)
Globulin, Total: 2.7 g/dL (ref 1.5–4.5)
Glucose: 103 mg/dL — ABNORMAL HIGH (ref 65–99)
HDL: 65 mg/dL (ref >39)
Hematocrit: 41.3 % (ref 34.0–46.6)
Hemoglobin: 13.8 g/dL (ref 11.1–15.9)
Immature Grans (Abs): 0 10*3/uL (ref 0.0–0.1)
Immature Granulocytes: 0 %
Iron: 100 ug/dL (ref 27–159)
LDH: 193 IU/L (ref 119–226)
LDL Chol Calc (NIH): 142 mg/dL — ABNORMAL HIGH (ref 0–99)
Lymphocytes Absolute: 1.3 10*3/uL (ref 0.7–3.1)
Lymphs: 19 %
MCH: 29.8 pg (ref 26.6–33.0)
MCHC: 33.4 g/dL (ref 31.5–35.7)
MCV: 89 fL (ref 79–97)
Monocytes Absolute: 0.7 10*3/uL (ref 0.1–0.9)
Monocytes: 10 %
Neutrophils Absolute: 4.6 10*3/uL (ref 1.4–7.0)
Neutrophils: 69 %
Phosphorus: 2.7 mg/dL — ABNORMAL LOW (ref 3.0–4.3)
Platelets: 340 10*3/uL (ref 150–450)
Potassium: 4 mmol/L (ref 3.5–5.2)
RBC: 4.63 x10E6/uL (ref 3.77–5.28)
RDW: 13.6 % (ref 11.7–15.4)
Sodium: 137 mmol/L (ref 134–144)
T3 Uptake Ratio: 26 % (ref 24–39)
T4, Total: 7 ug/dL (ref 4.5–12.0)
TSH: 3.22 u[IU]/mL (ref 0.450–4.500)
Total Protein: 7.4 g/dL (ref 6.0–8.5)
Triglycerides: 124 mg/dL (ref 0–149)
Uric Acid: 4.8 mg/dL (ref 3.0–7.2)
VLDL Cholesterol Cal: 22 mg/dL (ref 5–40)
WBC: 6.8 10*3/uL (ref 3.4–10.8)

## 2020-07-24 LAB — HGB A1C W/O EAG: Hgb A1c MFr Bld: 6 % — ABNORMAL HIGH (ref 4.8–5.6)

## 2020-07-24 LAB — VITAMIN D 25 HYDROXY (VIT D DEFICIENCY, FRACTURES): Vit D, 25-Hydroxy: 52.9 ng/mL (ref 30.0–100.0)

## 2020-08-01 ENCOUNTER — Other Ambulatory Visit: Payer: Self-pay

## 2020-08-01 ENCOUNTER — Encounter: Payer: Self-pay | Admitting: Nurse Practitioner

## 2020-08-01 ENCOUNTER — Ambulatory Visit: Payer: BC Managed Care – PPO | Admitting: Nurse Practitioner

## 2020-08-01 DIAGNOSIS — O24419 Gestational diabetes mellitus in pregnancy, unspecified control: Secondary | ICD-10-CM | POA: Insufficient documentation

## 2020-08-01 DIAGNOSIS — I10 Essential (primary) hypertension: Secondary | ICD-10-CM

## 2020-08-01 DIAGNOSIS — R7303 Prediabetes: Secondary | ICD-10-CM | POA: Insufficient documentation

## 2020-08-01 HISTORY — DX: Gestational diabetes mellitus in pregnancy, unspecified control: O24.419

## 2020-08-01 MED ORDER — ALBUTEROL SULFATE HFA 108 (90 BASE) MCG/ACT IN AERS: 2.0000 | INHALATION_SPRAY | Freq: Four times a day (QID) | RESPIRATORY_TRACT | 0 refills | Status: DC | PRN

## 2020-08-01 MED ORDER — HYDROCHLOROTHIAZIDE 25 MG PO TABS: 25.0000 mg | ORAL_TABLET | Freq: Every day | ORAL | 1 refills | Status: DC

## 2020-08-01 MED ORDER — AZITHROMYCIN 250 MG PO TABS: ORAL_TABLET | ORAL | 0 refills | Status: DC

## 2020-08-01 NOTE — Progress Notes (Signed)
Subjective:    Patient ID: Natalie Vargas, female    DOB: 1966/10/18, 54 y.o.   MRN: [id number]  HPI Here for annual physical and review of labs  Recent Results (from the past 2160 hour(s))  Novel Coronavirus, NAA ([facility])     Status: Abnormal   Collection Time: 06/20/20  2:37 PM   Specimen: Nasopharyngeal(NP) swabs in vial transport medium   Nasopharynge  Is this  Result Value Ref Range   SARS-CoV-2, NAA Detected (A) Not Detected    Comment: Patients who have a positive COVID-19 test result may now have treatment options. Treatment options are available for patients with mild to moderate symptoms and for hospitalized patients. Visit our website at [email or web address] for resources and information. This nucleic acid amplification test was developed and its performance characteristics determined by [employer]. Nucleic acid amplification tests include RT-PCR and TMA. This test has not been FDA cleared or approved. This test has been authorized by FDA under an Emergency Use Authorization (EUA). This test is only authorized for the duration of time the declaration that circumstances exist justifying the authorization of the emergency use of in vitro diagnostic tests for detection of SARS-CoV-2 virus and/or diagnosis of COVID-19 infection under section 564(b)(1) of the Act, 21 U.S.C. 409WJX-9(J) (1), unless the authorization is terminated or revoked sooner. When diagnostic testing is negativ e, the possibility of a false negative result should be considered in the context of a patient's recent exposures and the presence of clinical signs and symptoms consistent with COVID-19. An individual without symptoms of COVID-19 and who is not shedding SARS-CoV-2 virus would expect to have a negative (not detected) result in this assay.   POC COVID-19     Status: Normal   Collection Time: 06/20/20  2:37 PM  Result Value Ref Range   SARS Coronavirus 2 Ag Negative  Negative    Comment: Pt aware. PCR sent. Virtual visit completed with H.Ratcliffe PAC.  SARS-COV-2, NAA 2 DAY TAT     Status: None   Collection Time: 06/20/20  2:37 PM   Nasopharynge  Is this  Result Value Ref Range   SARS-CoV-2, NAA 2 DAY TAT Performed   VITAMIN D 25 Hydroxy (Vit-D Deficiency, Fractures)     Status: None   Collection Time: 07/23/20  8:27 AM  Result Value Ref Range   Vit D, 25-Hydroxy 52.9 30.0 - 100.0 ng/mL    Comment: Vitamin D deficiency has been defined by the [place] practice guideline as a level of serum 25-OH vitamin D less than 20 ng/mL (1,2). The Endocrine Society went on to further define vitamin D insufficiency as a level between 21 and 29 ng/mL (2). 1. IOM (Institute of Medicine). 2010. Dietary reference    intakes for calcium and D. [place]: The    [employer]. 2. Holick MF, Binkley [place], Bischoff-Ferrari HA, et al.    Evaluation, treatment, and prevention of vitamin D    deficiency: an Endocrine Society clinical practice    guideline. JCEM. 2011 Jul; 96(7):1911-30.   Hgb A1c w/o eAG     Status: Abnormal   Collection Time: 07/23/20  8:27 AM  Result Value Ref Range   Hgb A1c MFr Bld 6.0 (H) 4.8 - 5.6 %    Comment:          Prediabetes: 5.7 - 6.4          Diabetes: >6.4  Glycemic control for adults with diabetes: <7.0   CMP12+LP+TP+TSH+6AC+CBC/D/Plt     Status: Abnormal   Collection Time: 07/23/20  8:27 AM  Result Value Ref Range   Glucose 103 (H) 65 - 99 mg/dL   Uric Acid 4.8 3.0 - 7.2 mg/dL    Comment:            Therapeutic target for gout patients: <6.0   BUN 12 6 - 24 mg/dL   Creatinine, Ser 0.89 0.57 - 1.00 mg/dL   GFR calc non Af Amer 74 >59 mL/min/1.73   GFR calc Af Amer 86 >59 mL/min/1.73    Comment: **[facility] currently reports eGFR in compliance with the current**   recommendations of the [employer]. [facility] will   update reporting as new guidelines are  published from the NKF-ASN   Task force.    BUN/Creatinine Ratio 13 9 - 23   Sodium 137 134 - 144 mmol/L   Potassium 4.0 3.5 - 5.2 mmol/L   Chloride 99 96 - 106 mmol/L   Calcium 10.2 8.7 - 10.2 mg/dL   Phosphorus 2.7 (L) 3.0 - 4.3 mg/dL   Total Protein 7.4 6.0 - 8.5 g/dL   Albumin 4.7 3.8 - 4.9 g/dL   Globulin, Total 2.7 1.5 - 4.5 g/dL   Albumin/Globulin Ratio 1.7 1.2 - 2.2   Bilirubin Total 0.3 0.0 - 1.2 mg/dL   Alkaline Phosphatase 98 44 - 121 IU/L    Comment:               **Please note reference interval change**   LDH 193 119 - 226 IU/L   AST 18 0 - 40 IU/L   ALT 14 0 - 32 IU/L   GGT 13 0 - 60 IU/L   Iron 100 27 - 159 ug/dL   Cholesterol, Total 229 (H) 100 - 199 mg/dL   Triglycerides 124 0 - 149 mg/dL   HDL 65 >39 mg/dL   VLDL Cholesterol Cal 22 5 - 40 mg/dL   LDL Chol Calc (NIH) 142 (H) 0 - 99 mg/dL   Chol/HDL Ratio 3.5 0.0 - 4.4 ratio    Comment:                                   T. Chol/HDL Ratio                                             Men  Women                               1/2 Avg.Risk  3.4    3.3                                   Avg.Risk  5.0    4.4                                2X Avg.Risk  9.6    7.1  3X Avg.Risk 23.4   11.0    Estimated CHD Risk 0.6 0.0 - 1.0 times avg.    Comment: The CHD Risk is based on the T. Chol/HDL ratio. Other factors affect CHD Risk such as hypertension, smoking, diabetes, severe obesity, and family history of premature CHD.    TSH 3.220 0.450 - 4.500 uIU/mL   T4, Total 7.0 4.5 - 12.0 ug/dL   T3 Uptake Ratio 26 24 - 39 %   Free Thyroxine Index 1.8 1.2 - 4.9   WBC 6.8 3.4 - 10.8 x10E3/uL   RBC 4.63 3.77 - 5.28 x10E6/uL   Hemoglobin 13.8 11.1 - 15.9 g/dL   Hematocrit 41.3 34.0 - 46.6 %   MCV 89 79 - 97 fL   MCH 29.8 26.6 - 33.0 pg   MCHC 33.4 31 - 35 g/dL   RDW 13.6 11.7 - 15.4 %   Platelets 340 150 - 450 x10E3/uL   Neutrophils 69 Not Estab. %   Lymphs 19 Not Estab. %   Monocytes 10 Not  Estab. %   Eos 1 Not Estab. %   Basos 1 Not Estab. %   Neutrophils Absolute 4.6 1 - 7 x10E3/uL   Lymphocytes Absolute 1.3 0 - 3 x10E3/uL   Monocytes Absolute 0.7 0 - 0 x10E3/uL   EOS (ABSOLUTE) 0.1 0.0 - 0.4 x10E3/uL   Basophils Absolute 0.1 0 - 0 x10E3/uL   Immature Granulocytes 0 Not Estab. 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   Has not been to OBGYN in almost three years, last regular cycle was July has not had one since.   Going to [place] in January with [employer] - had previous telehealth visit regarding this. Aware she is not a candidate for Acetazolamide.   Last Mammo 11/20 needs annual- patient to schedule  Colonoscopy 2019 next 2024   Takes HCTZ daily no complaints   Hikes regularly did 14k in [place] this summer without issue  COVID in August now with new onset cough Does state her level of energy has remained low since she was sick   Asthma with exercise does not currently use inhaler   Cardiology (Dr. Garen Lah) last year after abnormal EKG, exam in office normal no indication for follow up    Today's Vitals   08/01/20 1113  BP: (!) 144/95  Pulse: 81  Resp: 16  Temp: (!) 97.1 F (36.2 C)  TempSrc: Temporal  SpO2: 100%  Weight: 166 lb (75.3 kg)  Height: 5' 5.5" (1.664 m)   Body mass index is 27.2 kg/m.  Recheck of BP manual: 137/87 left arm adult cuff performed by Apolonio Schneiders FNP  Current Outpatient Medications  Medication Instructions  . albuterol (VENTOLIN HFA) 108 (90 Base) MCG/ACT inhaler 2 puffs, Inhalation, Every 6 hours PRN  . azithromycin (ZITHROMAX) 250 MG tablet Take 2 tablets on day one, then one tablet daily on days 2-5. Take with food. <BR>Start if experiencing acute onset of diarrhea while traveling abroad  . Cholecalciferol (VITAMIN D PO) 1,000 mg,  Oral, 2 times daily  . Fish Oil 1,000 mg, Oral, Daily  . hydrochlorothiazide (HYDRODIURIL) 25 mg, Oral, Daily   Past Medical History:  Diagnosis Date  . Allergy   . 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  Hyperlipidemia   . Hypertension   . Pelvic floor dysfunction    Past Surgical History:  Procedure Laterality Date  . CESAREAN SECTION  1994  . COLONOSCOPY WITH PROPOFOL N/A 06/23/2018   Procedure: COLONOSCOPY WITH PROPOFOL;  Surgeon: Jonathon Bellows, MD;  [facility] ENDOSCOPY;  Service: Gastroenterology;  Laterality: N/A;    Family History  Problem Relation Age of Onset  . Hypertension Mother   . Hyperlipidemia Mother   . Cancer Father   . Hyperlipidemia Father   . Congestive Heart Failure Father   . Skin cancer Father   . Stroke Paternal Aunt   . Breast cancer Neg Hx    Never smoker   Review of Systems  Constitutional: Positive for fatigue.  HENT: Negative.   Eyes: Negative.   Respiratory: Positive for cough.   Cardiovascular: Negative.   Gastrointestinal: Negative.   Genitourinary: Negative.   Musculoskeletal: Negative.   Neurological: Negative.   Psychiatric/Behavioral: Negative.         Objective:   Physical Exam        Assessment & Plan:  Last Mammo 11/20 needs annual- patient to schedule   Discussed taking malaria prophylaxis for trip to [place]-   Patient would like to travel with as needed antibtiotic for travelers diarrhea, discussed most likely not needed, and to only take if symptoms persist and are severe, otherwise  F/u at clinic once returning.   Start inhaler prior to exercise and use as needed with cough. May continue Mucinex OTC for up to one week, if cough persists RTC for further evaluation.   Discussed diet and will refer to dietician for discussion of prediabetic and high cholesterol dietary interventions.   Next colonoscopy will be 2024  Patient will schedule f/u with OBGYN to discuss  menopause and for Pap   RTC in one year for labs/physical   Refill HCTZ and will continue to monitor blood pressure   Meds ordered this encounter  Medications  . albuterol (VENTOLIN HFA) 108 (90 Base) MCG/ACT inhaler    Sig: Inhale 2 puffs into the lungs every 6 (six) hours as needed for wheezing or shortness of breath (use 30 minutes prior to exercise).    Dispense:  8 g    Refill:  0  . azithromycin (ZITHROMAX) 250 MG tablet    Sig: Take 2 tablets on day one, then one tablet daily on days 2-5. Take with food.  Start if experiencing acute onset of diarrhea while traveling abroad    Dispense:  6 tablet    Refill:  0  . hydrochlorothiazide (HYDRODIURIL) 25 MG tablet    Sig: Take 1 tablet (25 mg total) by mouth daily.    Dispense:  90 tablet    Refill:  1

## 2020-08-01 NOTE — Patient Instructions (Signed)
Schedule Mammogram   Schedule f/u with OBGYN   Next Colonoscopy 2024  Inhaler prior to exercise and as needed for cough  Continue Mucinex as needed for up to one week-if cough persists RTC as discussed  Dietary modifications discussed will refer to [facility] May for nutritional guidance due to prediabetic levels and hyperlipidemia found on labs   Research need for malaria prophylaxis on trip and f/u as needed

## 2020-08-02 ENCOUNTER — Encounter: Payer: Self-pay | Admitting: Nurse Practitioner

## 2020-08-28 ENCOUNTER — Other Ambulatory Visit: Payer: Self-pay

## 2020-08-28 ENCOUNTER — Ambulatory Visit: Payer: BC Managed Care – PPO

## 2020-08-28 NOTE — Progress Notes (Unsigned)
08/28/2020 Nutrition Consult  CC:" Last time I saw my physician, she recommended that I speak with the dietitian to help with eating better to lower my cholesterol and blood pressure."  Assessment: Ht: 5'3''   Wt: 166 lbs  BMI: 27.2 Kg/m2 Hx:GDM, HTN, Increased cholesterol, Pre diabetes. Labs; Total cholesterol=229, HDL= 65, LDL= 142, TG= 121, VLDL= 22. A1C= 6.0%, Fasting glucose 103 mg/dl.  Exercise/Activity: Regularly exercises.  Monday/Thursday = Cardio and strength class. Tuesday and Friday = Will run 2 miles or a little more.   Doing the walking challenge and generally tries to get in 5 miles each day.  Dietary: Breakfast: Generally has coffee with flavored creamer.  Rue Tinnel have a protein bar or Sira Adsit have a boiled egg instead, or pack of oatmeal plain or flavored and  will add fruit or maple syrup.  Generally will not snack in the morning.  Lunch: Generally leftovers from evening meal. Will eat in the cafe on campus.  Likes to get the low carb options such as the vegetable noodles with either chicken or shrimp with onions, mushrooms and a salad on the side.Drinks water at meals and throughout the day.  Afternoon snacks:  Generally does not snack, but Noemi Bellissimo grab a mini candy bar or cookie or cheese and crackers.  Always sips on water.  Dinner: Varies.  Generally will eat out 2 nights per week., generally during the weekend.  Dinner meals sometimes maybe snack meals. At least 2 evenings per week will have [employer] meals.  Finds these to be high in salt.  Meals include a meat, starchy /carb and a non-starchy vegetable or salad.  Evening snack: Generally no snacks but 1-2 evenings Dayzee Trower have a scoop of ice cream with a dash of bailey's cream .  Recommendations: Keep up the regular exercise/running routine. Look to use poultry and fish more often than red meats. Bake, broil, grill, roast r/t fry.  If sauteing, use olive oil or grape seed oil.   For fats, use monounsaturated and  polyunsaturated fats and oils.  Limit to recommended serving sizes.   Try to limit animal fats and palm oil that are high in saturated fats. Omit added fats. When doing salad dressings, try using olive oil and vinegar and limit/omit salt and go for using more herbs and spices instead. Increase fiber in your diet with the use of whole grain breads and cereals.  Aim for 3 grams of fiber per serving for breads and cereals. Continue to use fruits, especially the berries that are higher in fiber and lower in sugar. Plan to have a non-starchy vegetable at all meals. When cooking look to decrease the amount of salt called for in the recipe.   Don't add salt when dining out or at home. Read food labels and look for fiber and low sodium content.    Teaching tools: Controlling Cholesterol Handout. Managing Blood Pressure Handout. Food Groups Handout What is on the Food Label handout.  F/U as desires.

## 2020-09-30 ENCOUNTER — Encounter: Payer: Self-pay | Admitting: Nurse Practitioner

## 2020-09-30 ENCOUNTER — Other Ambulatory Visit: Payer: Self-pay | Admitting: Obstetrics and Gynecology

## 2020-09-30 ENCOUNTER — Other Ambulatory Visit: Payer: Self-pay | Admitting: Nurse Practitioner

## 2020-09-30 DIAGNOSIS — Z1231 Encounter for screening mammogram for malignant neoplasm of breast: Secondary | ICD-10-CM

## 2020-10-01 ENCOUNTER — Other Ambulatory Visit: Payer: Self-pay

## 2020-10-01 ENCOUNTER — Ambulatory Visit
Admission: RE | Admit: 2020-10-01 | Discharge: 2020-10-01 | Disposition: A | Discharge status: Home / Self Care | Payer: BC Managed Care – PPO | Source: Ambulatory Visit | Attending: Nurse Practitioner | Admitting: Nurse Practitioner

## 2020-10-01 DIAGNOSIS — Z1231 Encounter for screening mammogram for malignant neoplasm of breast: Secondary | ICD-10-CM | POA: Diagnosis not present

## 2020-10-07 ENCOUNTER — Other Ambulatory Visit: Payer: Self-pay | Admitting: Nurse Practitioner

## 2020-10-07 DIAGNOSIS — N6489 Other specified disorders of breast: Secondary | ICD-10-CM

## 2020-10-07 DIAGNOSIS — R928 Other abnormal and inconclusive findings on diagnostic imaging of breast: Secondary | ICD-10-CM

## 2020-10-09 ENCOUNTER — Other Ambulatory Visit: Payer: Self-pay | Admitting: Nurse Practitioner

## 2020-10-09 MED ORDER — ACETAZOLAMIDE 125 MG PO TABS: 125.0000 mg | ORAL_TABLET | Freq: Two times a day (BID) | ORAL | 0 refills | Status: DC

## 2020-10-09 NOTE — Progress Notes (Signed)
Discussed with patient risk for taking a sulfa based drug.  After her own research patient would like to attempt a short course of Diamox prior to travel, her reaction to a sulfa medication was 15 years ago and reaction was a rash.   She will be instructed to try Diamox once daily for 5 days (125mg  once daily), to use benadryl with any signs of reaction and to seek immediate medical attention for any acute symptoms as discussed.   Also instructed to carry benadryl with during travel.   She will have a 5 day trial while home in December prior to travel, will follow up with any concerns. If no SEs are noted she will continue with instructions for taking medication during her mountain climb. Instructed to start the day before ascent, 125 mg twice daily; may be discontinued after staying at the same elevation for 2 to 4 days or if descent is initiated

## 2020-10-22 ENCOUNTER — Ambulatory Visit
Admission: RE | Admit: 2020-10-22 | Discharge: 2020-10-22 | Disposition: A | Discharge status: Home / Self Care | Payer: BC Managed Care – PPO | Source: Ambulatory Visit | Attending: Nurse Practitioner | Admitting: Nurse Practitioner

## 2020-10-22 ENCOUNTER — Other Ambulatory Visit: Payer: Self-pay

## 2020-10-22 DIAGNOSIS — R928 Other abnormal and inconclusive findings on diagnostic imaging of breast: Secondary | ICD-10-CM

## 2020-10-22 DIAGNOSIS — N6489 Other specified disorders of breast: Secondary | ICD-10-CM | POA: Insufficient documentation

## 2020-10-22 NOTE — Telephone Encounter (Signed)
FYI Sarah

## 2021-05-08 ENCOUNTER — Other Ambulatory Visit: Payer: Self-pay | Admitting: Nurse Practitioner

## 2021-05-08 ENCOUNTER — Telehealth: Payer: Self-pay | Admitting: Nurse Practitioner

## 2021-05-08 DIAGNOSIS — Z0000 Encounter for general adult medical examination without abnormal findings: Secondary | ICD-10-CM

## 2021-05-08 DIAGNOSIS — I10 Essential (primary) hypertension: Secondary | ICD-10-CM

## 2021-05-08 MED ORDER — HYDROCHLOROTHIAZIDE 25 MG PO TABS: 25.0000 mg | ORAL_TABLET | Freq: Every day | ORAL | 1 refills | Status: DC

## 2021-05-08 NOTE — Telephone Encounter (Signed)
Left message for patient will send refill of BP medication, patient to schedule annual physical and labs before September.

## 2021-05-15 ENCOUNTER — Other Ambulatory Visit: Payer: BC Managed Care – PPO

## 2021-05-15 ENCOUNTER — Other Ambulatory Visit: Payer: Self-pay

## 2021-05-15 DIAGNOSIS — Z0000 Encounter for general adult medical examination without abnormal findings: Secondary | ICD-10-CM

## 2021-05-15 NOTE — Progress Notes (Signed)
Lab work

## 2021-05-16 LAB — CMP12+LP+TP+TSH+6AC+CBC/D/PLT
ALT: 14 IU/L (ref 0–32)
AST: 22 IU/L (ref 0–40)
Albumin/Globulin Ratio: 1.4 (ref 1.2–2.2)
Albumin: 4.3 g/dL (ref 3.8–4.9)
Alkaline Phosphatase: 90 IU/L (ref 44–121)
BUN/Creatinine Ratio: 13 (ref 9–23)
BUN: 12 mg/dL (ref 6–24)
Basophils Absolute: 0.1 10*3/uL (ref 0.0–0.2)
Basos: 1 %
Bilirubin Total: 0.3 mg/dL (ref 0.0–1.2)
Calcium: 9.9 mg/dL (ref 8.7–10.2)
Chloride: 99 mmol/L (ref 96–106)
Chol/HDL Ratio: 3.5 ratio (ref 0.0–4.4)
Cholesterol, Total: 238 mg/dL — ABNORMAL HIGH (ref 100–199)
Creatinine, Ser: 0.93 mg/dL (ref 0.57–1.00)
EOS (ABSOLUTE): 0.1 10*3/uL (ref 0.0–0.4)
Eos: 3 %
Estimated CHD Risk: 0.6 times avg. (ref 0.0–1.0)
Free Thyroxine Index: 1.5 (ref 1.2–4.9)
GGT: 11 IU/L (ref 0–60)
Globulin, Total: 3.1 g/dL (ref 1.5–4.5)
Glucose: 104 mg/dL — ABNORMAL HIGH (ref 65–99)
HDL: 68 mg/dL (ref >39)
Hematocrit: 36.1 % (ref 34.0–46.6)
Hemoglobin: 12.1 g/dL (ref 11.1–15.9)
Immature Grans (Abs): 0 10*3/uL (ref 0.0–0.1)
Immature Granulocytes: 0 %
Iron: 93 ug/dL (ref 27–159)
LDH: 199 IU/L (ref 119–226)
LDL Chol Calc (NIH): 148 mg/dL — ABNORMAL HIGH (ref 0–99)
Lymphocytes Absolute: 1.4 10*3/uL (ref 0.7–3.1)
Lymphs: 29 %
MCH: 30.9 pg (ref 26.6–33.0)
MCHC: 33.5 g/dL (ref 31.5–35.7)
MCV: 92 fL (ref 79–97)
Monocytes Absolute: 0.5 10*3/uL (ref 0.1–0.9)
Monocytes: 11 %
Neutrophils Absolute: 2.7 10*3/uL (ref 1.4–7.0)
Neutrophils: 56 %
Phosphorus: 3.7 mg/dL (ref 3.0–4.3)
Platelets: 364 10*3/uL (ref 150–450)
Potassium: 4 mmol/L (ref 3.5–5.2)
RBC: 3.92 x10E6/uL (ref 3.77–5.28)
RDW: 12.6 % (ref 11.7–15.4)
Sodium: 140 mmol/L (ref 134–144)
T3 Uptake Ratio: 24 % (ref 24–39)
T4, Total: 6.4 ug/dL (ref 4.5–12.0)
TSH: 3.78 u[IU]/mL (ref 0.450–4.500)
Total Protein: 7.4 g/dL (ref 6.0–8.5)
Triglycerides: 123 mg/dL (ref 0–149)
Uric Acid: 4.2 mg/dL (ref 3.0–7.2)
VLDL Cholesterol Cal: 22 mg/dL (ref 5–40)
WBC: 4.8 10*3/uL (ref 3.4–10.8)
eGFR: 73 mL/min/{1.73_m2} (ref >59)

## 2021-05-16 LAB — URINALYSIS, ROUTINE W REFLEX MICROSCOPIC
Bilirubin, UA: NEGATIVE
Glucose, UA: NEGATIVE
Ketones, UA: NEGATIVE
Leukocytes,UA: NEGATIVE
Nitrite, UA: NEGATIVE
Protein,UA: NEGATIVE
RBC, UA: NEGATIVE
Specific Gravity, UA: 1.011 (ref 1.005–1.030)
Urobilinogen, Ur: 0.2 mg/dL (ref 0.2–1.0)
pH, UA: 7.5 (ref 5.0–7.5)

## 2021-05-16 LAB — VITAMIN D 25 HYDROXY (VIT D DEFICIENCY, FRACTURES): Vit D, 25-Hydroxy: 31.3 ng/mL (ref 30.0–100.0)

## 2021-05-16 LAB — HCV INTERPRETATION

## 2021-05-16 LAB — HGB A1C W/O EAG: Hgb A1c MFr Bld: 5.8 % — ABNORMAL HIGH (ref 4.8–5.6)

## 2021-05-16 LAB — HIV ANTIBODY (ROUTINE TESTING W REFLEX): HIV Screen 4th Generation wRfx: NONREACTIVE

## 2021-05-16 LAB — HCV AB W REFLEX TO QUANT PCR: HCV Ab: 0.1 s/co ratio (ref 0.0–0.9)

## 2021-06-19 ENCOUNTER — Encounter: Payer: Self-pay | Admitting: Nurse Practitioner

## 2021-06-19 ENCOUNTER — Other Ambulatory Visit: Payer: Self-pay

## 2021-06-19 ENCOUNTER — Ambulatory Visit: Payer: BC Managed Care – PPO | Admitting: Nurse Practitioner

## 2021-06-19 VITALS — BP 114/78 | HR 74 | Temp 98.1°F | Resp 16 | Ht 66.0 in | Wt 174.0 lb

## 2021-06-19 DIAGNOSIS — Z23 Encounter for immunization: Secondary | ICD-10-CM

## 2021-06-19 DIAGNOSIS — Z1231 Encounter for screening mammogram for malignant neoplasm of breast: Secondary | ICD-10-CM

## 2021-06-19 DIAGNOSIS — R7303 Prediabetes: Secondary | ICD-10-CM

## 2021-06-19 DIAGNOSIS — E782 Mixed hyperlipidemia: Secondary | ICD-10-CM

## 2021-06-19 DIAGNOSIS — I10 Essential (primary) hypertension: Secondary | ICD-10-CM

## 2021-06-19 MED ORDER — SHINGRIX 50 MCG/0.5ML IM SUSR: 0.5000 mL | Freq: Once | INTRAMUSCULAR | 0 refills | Status: AC

## 2021-06-19 NOTE — Progress Notes (Signed)
Subjective:    Patient ID: Natalie Vargas, female    DOB: 07-Feb-1966, 55 y.o.   MRN: [id number]  HPI  55 year old female presenting to [facility] for annual physical.   She recently climbed [place] and used Diamox without and SE.   Denies any current health concerns   She will be participating in [facility] program with Exercise Science department that begins later this month.   Hikes/exercises regularly.  Denies a history of tobacco use Up to Date on COVID vaccines   Has not had Shingles Vaccine.  Due for Mammogram 10/2021 Due for Colonoscopy 2024   All other scheduled health maintenance up to date   She has seen Cardiology in the past due to one incidence of abnormal EKG- cleared from cardiology without indicated need for follow up   Has not seen OBGYN recently, last menses > 1 month has started irregular cycle denies any other symptoms of menopause at this time   Today's Vitals   06/19/21 1109  BP: 114/78  Pulse: 74  Resp: 16  Temp: 98.1 F (36.7 C)  TempSrc: Oral  SpO2: 98%  Weight: 174 lb (78.9 kg)  Height: _0  (1.676 m)   Body mass index is 28.08 kg/m.   Current Outpatient Medications  Medication Instructions   Cholecalciferol (VITAMIN D PO) 1,000 mg, Oral, 2 times daily   Fish Oil 1,000 mg, Oral, Daily   hydrochlorothiazide (HYDRODIURIL) 25 mg, Oral, Daily   Multiple Vitamin (MULTIVITAMIN) tablet 1 tablet, Oral, Daily    Review of Systems  Constitutional: Negative.   HENT: Negative.    Eyes: Negative.   Respiratory: Negative.    Cardiovascular: Negative.   Gastrointestinal: Negative.   Endocrine: Negative.   Genitourinary: Negative.   Musculoskeletal: Negative.   Allergic/Immunologic: Negative.   Neurological: Negative.   Hematological: Negative.   Psychiatric/Behavioral: Negative.      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  HENT:     Head: Normocephalic.     Right Ear: Tympanic membrane, ear canal and external ear normal.     Left Ear: Tympanic membrane, ear canal and external ear normal.     Nose: Nose normal.     Mouth/Throat:     Mouth: Mucous membranes are moist.     Pharynx: Oropharynx is clear.  Eyes:     Extraocular Movements: Extraocular movements intact.     Conjunctiva/sclera: Conjunctivae normal.     Pupils: Pupils are equal, round, and reactive to light.  Cardiovascular:     Rate and Rhythm: Normal rate and regular rhythm.     Heart sounds: Normal heart sounds.  Pulmonary:     Effort: Pulmonary effort is normal.     Breath sounds: Normal breath sounds.  Musculoskeletal:     Cervical back: Normal range of motion.  Skin:    General: Skin is warm and dry.  Neurological:     General: No focal deficit present.     Mental Status: She is alert.  Psychiatric:        Mood and Affect: Mood normal.      Recent Results (from the past  2160 hour(s))  HCV Ab w Reflex to Quant PCR     Status: None   Collection Time: 05/15/21  7:45 AM  Result Value Ref Range   HCV Ab 0.1 0.0 - 0.9 s/co ratio  HIV Antibody (routine testing w rflx)     Status: None   Collection Time: 05/15/21  7:45 AM  Result Value Ref Range   HIV Screen 4th Generation wRfx Non Reactive Non Reactive    Comment: HIV Negative HIV-1/HIV-2 antibodies and HIV-1 p24 antigen were NOT detected. There is no laboratory evidence of HIV infection.   VITAMIN D 25 Hydroxy (Vit-D Deficiency, Fractures)     Status: None   Collection Time: 05/15/21  7:45 AM  Result Value Ref Range   Vit D, 25-Hydroxy 31.3 30.0 - 100.0 ng/mL    Comment: Vitamin D deficiency has been defined by the [place] practice guideline as  a level of serum 25-OH vitamin D less than 20 ng/mL (1,2). The Endocrine Society went on to further define vitamin D insufficiency as a level between 21 and 29 ng/mL (2). 1. IOM (Institute of Medicine). 2010. Dietary reference    intakes for calcium and D. [place]: The    [employer]. 2. Holick MF, Binkley [place], Bischoff-Ferrari HA, et al.    Evaluation, treatment, and prevention of vitamin D    deficiency: an Endocrine Society clinical practice    guideline. JCEM. 2011 Jul; 96(7):1911-30.   Hgb A1c w/o eAG     Status: Abnormal   Collection Time: 05/15/21  7:45 AM  Result Value Ref Range   Hgb A1c MFr Bld 5.8 (H) 4.8 - 5.6 %    Comment:          Prediabetes: 5.7 - 6.4          Diabetes: >6.4          Glycemic control for adults with diabetes: <7.0   CMP12+LP+TP+TSH+6AC+CBC/D/Plt     Status: Abnormal   Collection Time: 05/15/21  7:45 AM  Result Value Ref Range   Glucose 104 (H) 65 - 99 mg/dL   Uric Acid 4.2 3.0 - 7.2 mg/dL    Comment:            Therapeutic target for gout patients: <6.0   BUN 12 6 - 24 mg/dL   Creatinine, Ser 0.93 0.57 - 1.00 mg/dL   eGFR 73 >59 mL/min/1.73   BUN/Creatinine Ratio 13 9 - 23   Sodium 140 134 - 144 mmol/L   Potassium 4.0 3.5 - 5.2 mmol/L   Chloride 99 96 - 106 mmol/L   Calcium 9.9 8.7 - 10.2 mg/dL   Phosphorus 3.7 3.0 - 4.3 mg/dL   Total Protein 7.4 6.0 - 8.5 g/dL   Albumin 4.3 3.8 - 4.9 g/dL   Globulin, Total 3.1 1.5 - 4.5 g/dL   Albumin/Globulin Ratio 1.4 1.2 - 2.2   Bilirubin Total 0.3 0.0 - 1.2 mg/dL   Alkaline Phosphatase 90 44 - 121 IU/L   LDH 199 119 - 226 IU/L   AST 22 0 - 40 IU/L   ALT 14 0 - 32 IU/L   GGT 11 0 - 60 IU/L   Iron 93 27 - 159 ug/dL   Cholesterol, Total 238 (H) 100 - 199 mg/dL   Triglycerides 123 0 - 149 mg/dL   HDL 68 >39 mg/dL   VLDL Cholesterol Cal 22 5 - 40 mg/dL   LDL Chol Calc (NIH) 148 (H)  0 - 99 mg/dL   Chol/HDL Ratio 3.5 0.0 - 4.4 ratio    Comment:                                   T.  Chol/HDL Ratio                                             Men  Women                               1/2 Avg.Risk  3.4    3.3                                   Avg.Risk  5.0    4.4                                2X Avg.Risk  9.6    7.1                                3X Avg.Risk 23.4   11.0    Estimated CHD Risk 0.6 0.0 - 1.0 times avg.    Comment: The CHD Risk is based on the T. Chol/HDL ratio. Other factors affect CHD Risk such as hypertension, smoking, diabetes, severe obesity, and family history of premature CHD.    TSH 3.780 0.450 - 4.500 uIU/mL   T4, Total 6.4 4.5 - 12.0 ug/dL   T3 Uptake Ratio 24 24 - 39 %   Free Thyroxine Index 1.5 1.2 - 4.9   WBC 4.8 3.4 - 10.8 x10E3/uL   RBC 3.92 3.77 - 5.28 x10E6/uL   Hemoglobin 12.1 11.1 - 15.9 g/dL   Hematocrit 36.1 34.0 - 46.6 %   MCV 92 79 - 97 fL   MCH 30.9 26.6 - 33.0 pg   MCHC 33.5 31.5 - 35.7 g/dL   RDW 12.6 11.7 - 15.4 %   Platelets 364 150 - 450 x10E3/uL   Neutrophils 56 Not Estab. %   Lymphs 29 Not Estab. %   Monocytes 11 Not Estab. %   Eos 3 Not Estab. %   Basos 1 Not Estab. %   Neutrophils Absolute 2.7 1.4 - 7.0 x10E3/uL   Lymphocytes Absolute 1.4 0.7 - 3.1 x10E3/uL   Monocytes Absolute 0.5 0.1 - 0.9 x10E3/uL   EOS (ABSOLUTE) 0.1 0.0 - 0.4 x10E3/uL   Basophils Absolute 0.1 0.0 - 0.2 x10E3/uL   Immature Granulocytes 0 Not Estab. %   Immature Grans (Abs) 0.0 0.0 - 0.1 x10E3/uL  Urinalysis, Routine w reflex microscopic     Status: None   Collection Time: 05/15/21  7:45 AM  Result Value Ref Range   Specific Gravity, UA 1.011 1.005 - 1.030   pH, UA 7.5 5.0 - 7.5   Color, UA Yellow Yellow   Appearance Ur Clear Clear   Leukocytes,UA Negative Negative   Protein,UA Negative Negative/Trace   Glucose, UA Negative Negative   Ketones, UA Negative Negative   RBC, UA Negative Negative   Bilirubin, UA Negative Negative   Urobilinogen, Ur  0.2 0.2 - 1.0 mg/dL   Nitrite, UA Negative Negative   Microscopic Examination Comment      Comment: Microscopic not indicated and not performed.  Interpretation:     Status: None   Collection Time: 05/15/21  7:45 AM  Result Value Ref Range   HCV Interp 1: Comment     Comment: Negative Not infected with HCV, unless recent infection is suspected or other evidence exists to indicate HCV infection.        Assessment & Plan:  1. Need for viral immunization  - Zoster Vaccine Adjuvanted [facility]) injection; Inject 0.5 mLs into the muscle once for 1 dose.  Dispense: 0.5 mL; Refill: 0 Order sent to pharmacy for patient to schedule  2. Encounter for screening mammogram for malignant neoplasm of breast  - MM Digital Screening; Future Ordered to be completed 10/2021 patient to schedule  3. Prediabetes Continue with plan to participate in [employer] program, will plan to recheck labs here in 6 months.  Has met with Dietician in the past   4. Moderate mixed hyperlipidemia not requiring statin therapy Continue Omega3 supplement  Participate in [employer] program and repeat labs here in 6 months to evaluate   5. Primary hypertension Continue HCTZ      Followup in 6 months for repeat labs (lipids, CMP, A1C)  Consider calcium scoring if cholestrol and A1c remain elevated   Continue HCTZ daily   Consider spacing Vitamin D and HCTZ as discussed   Mammogram due in December Colonoscopy due in 2024 Sent Shingles vaccine order to pharmacy   Up to date on COVID Vaccine series at this time.

## 2021-08-13 ENCOUNTER — Other Ambulatory Visit: Payer: Self-pay

## 2021-08-13 ENCOUNTER — Ambulatory Visit: Payer: BC Managed Care – PPO

## 2021-08-27 ENCOUNTER — Other Ambulatory Visit: Payer: Self-pay

## 2021-08-27 ENCOUNTER — Ambulatory Visit: Payer: BC Managed Care – PPO

## 2021-08-27 DIAGNOSIS — Z23 Encounter for immunization: Secondary | ICD-10-CM

## 2021-10-14 ENCOUNTER — Other Ambulatory Visit: Payer: Self-pay | Admitting: Nurse Practitioner

## 2021-10-14 DIAGNOSIS — Z1231 Encounter for screening mammogram for malignant neoplasm of breast: Secondary | ICD-10-CM

## 2021-10-30 ENCOUNTER — Other Ambulatory Visit: Payer: Self-pay

## 2021-10-30 ENCOUNTER — Ambulatory Visit
Admission: RE | Admit: 2021-10-30 | Discharge: 2021-10-30 | Disposition: A | Discharge status: Home / Self Care | Payer: BC Managed Care – PPO | Source: Ambulatory Visit | Attending: Nurse Practitioner | Admitting: Nurse Practitioner

## 2021-10-30 DIAGNOSIS — Z1231 Encounter for screening mammogram for malignant neoplasm of breast: Secondary | ICD-10-CM

## 2021-11-25 ENCOUNTER — Encounter: Payer: Self-pay | Admitting: Nurse Practitioner

## 2021-11-25 DIAGNOSIS — R7309 Other abnormal glucose: Secondary | ICD-10-CM

## 2021-11-25 DIAGNOSIS — I10 Essential (primary) hypertension: Secondary | ICD-10-CM

## 2021-11-26 MED ORDER — HYDROCHLOROTHIAZIDE 25 MG PO TABS: 25.0000 mg | ORAL_TABLET | Freq: Every day | ORAL | 1 refills | Status: DC

## 2021-12-18 ENCOUNTER — Other Ambulatory Visit: Payer: Self-pay

## 2021-12-18 ENCOUNTER — Other Ambulatory Visit: Payer: BC Managed Care – PPO

## 2021-12-18 DIAGNOSIS — R7309 Other abnormal glucose: Secondary | ICD-10-CM

## 2021-12-18 DIAGNOSIS — I10 Essential (primary) hypertension: Secondary | ICD-10-CM

## 2021-12-19 LAB — CMP12+LP+TP+TSH+6AC+CBC/D/PLT: Immature Grans (Abs): 0 10*3/uL (ref 0.0–0.1)

## 2021-12-20 ENCOUNTER — Encounter: Payer: Self-pay | Admitting: Nurse Practitioner

## 2021-12-20 LAB — CMP12+LP+TP+TSH+6AC+CBC/D/PLT
ALT: 9 IU/L (ref 0–32)
AST: 16 IU/L (ref 0–40)
Albumin/Globulin Ratio: 1.7 (ref 1.2–2.2)
Albumin: 4.4 g/dL (ref 3.8–4.9)
Alkaline Phosphatase: 101 IU/L (ref 44–121)
BUN/Creatinine Ratio: 13 (ref 9–23)
BUN: 12 mg/dL (ref 6–24)
Basophils Absolute: 0.1 10*3/uL (ref 0.0–0.2)
Basos: 1 %
Bilirubin Total: 0.3 mg/dL (ref 0.0–1.2)
Calcium: 9.8 mg/dL (ref 8.7–10.2)
Chloride: 101 mmol/L (ref 96–106)
Chol/HDL Ratio: 3.8 ratio (ref 0.0–4.4)
Cholesterol, Total: 229 mg/dL — ABNORMAL HIGH (ref 100–199)
Creatinine, Ser: 0.93 mg/dL (ref 0.57–1.00)
EOS (ABSOLUTE): 0.2 10*3/uL (ref 0.0–0.4)
Eos: 4 %
Estimated CHD Risk: 0.8 times avg. (ref 0.0–1.0)
Free Thyroxine Index: 1.8 (ref 1.2–4.9)
GGT: 11 IU/L (ref 0–60)
Globulin, Total: 2.6 g/dL (ref 1.5–4.5)
Glucose: 99 mg/dL (ref 70–99)
HDL: 60 mg/dL (ref >39)
Hematocrit: 39.9 % (ref 34.0–46.6)
Hemoglobin: 13.4 g/dL (ref 11.1–15.9)
Immature Grans (Abs): 0 10*3/uL (ref 0.0–0.1)
Immature Granulocytes: 0 %
Iron: 106 ug/dL (ref 27–159)
LDH: 192 IU/L (ref 119–226)
LDL Chol Calc (NIH): 146 mg/dL — ABNORMAL HIGH (ref 0–99)
Lymphocytes Absolute: 1.6 10*3/uL (ref 0.7–3.1)
Lymphs: 31 %
MCH: 30 pg (ref 26.6–33.0)
MCHC: 33.6 g/dL (ref 31.5–35.7)
MCV: 89 fL (ref 79–97)
Monocytes Absolute: 0.6 10*3/uL (ref 0.1–0.9)
Monocytes: 12 %
Neutrophils Absolute: 2.7 10*3/uL (ref 1.4–7.0)
Neutrophils: 52 %
Phosphorus: 3.4 mg/dL (ref 3.0–4.3)
Platelets: 361 10*3/uL (ref 150–450)
Potassium: 4.7 mmol/L (ref 3.5–5.2)
RBC: 4.47 x10E6/uL (ref 3.77–5.28)
RDW: 12.8 % (ref 11.7–15.4)
Sodium: 140 mmol/L (ref 134–144)
T3 Uptake Ratio: 26 % (ref 24–39)
T4, Total: 7.1 ug/dL (ref 4.5–12.0)
TSH: 2.99 u[IU]/mL (ref 0.450–4.500)
Total Protein: 7 g/dL (ref 6.0–8.5)
Triglycerides: 128 mg/dL (ref 0–149)
Uric Acid: 4 mg/dL (ref 3.0–7.2)
VLDL Cholesterol Cal: 23 mg/dL (ref 5–40)
WBC: 5.2 10*3/uL (ref 3.4–10.8)
eGFR: 73 mL/min/{1.73_m2} (ref >59)

## 2021-12-20 LAB — HGB A1C W/O EAG: Hgb A1c MFr Bld: 6 % — ABNORMAL HIGH (ref 4.8–5.6)

## 2021-12-25 ENCOUNTER — Ambulatory Visit: Payer: BC Managed Care – PPO | Admitting: Nurse Practitioner

## 2021-12-25 ENCOUNTER — Other Ambulatory Visit: Payer: Self-pay

## 2021-12-25 DIAGNOSIS — E782 Mixed hyperlipidemia: Secondary | ICD-10-CM

## 2021-12-25 MED ORDER — SIMVASTATIN 10 MG PO TABS: 10.0000 mg | ORAL_TABLET | Freq: Every day | ORAL | 3 refills | Status: AC

## 2021-12-25 NOTE — Progress Notes (Signed)
Reviewed labs with patient- discussed starting a statin.  Will also send education on diet and patient is starting to use [employer] app to track foods and BP Follow up in 6 months for repeat labs and physical    Recent Results (from the past 2160 hour(s))  CMP12+LP+TP+TSH+6AC+CBC/D/Plt     Status: Abnormal   Collection Time: 12/18/21  7:53 AM  Result Value Ref Range   Glucose 99 70 - 99 mg/dL   Uric Acid 4.0 3.0 - 7.2 mg/dL    Comment:            Therapeutic target for gout patients: <6.0   BUN 12 6 - 24 mg/dL   Creatinine, Ser 0.93 0.57 - 1.00 mg/dL   eGFR 73 >59 mL/min/1.73   BUN/Creatinine Ratio 13 9 - 23   Sodium 140 134 - 144 mmol/L   Potassium 4.7 3.5 - 5.2 mmol/L   Chloride 101 96 - 106 mmol/L   Calcium 9.8 8.7 - 10.2 mg/dL   Phosphorus 3.4 3.0 - 4.3 mg/dL   Total Protein 7.0 6.0 - 8.5 g/dL   Albumin 4.4 3.8 - 4.9 g/dL   Globulin, Total 2.6 1.5 - 4.5 g/dL   Albumin/Globulin Ratio 1.7 1.2 - 2.2   Bilirubin Total 0.3 0.0 - 1.2 mg/dL   Alkaline Phosphatase 101 44 - 121 IU/L   LDH 192 119 - 226 IU/L   AST 16 0 - 40 IU/L   ALT 9 0 - 32 IU/L   GGT 11 0 - 60 IU/L   Iron 106 27 - 159 ug/dL   Cholesterol, Total 229 (H) 100 - 199 mg/dL   Triglycerides 128 0 - 149 mg/dL   HDL 60 >39 mg/dL   VLDL Cholesterol Cal 23 5 - 40 mg/dL   LDL Chol Calc (NIH) 146 (H) 0 - 99 mg/dL   Chol/HDL Ratio 3.8 0.0 - 4.4 ratio    Comment:                                   T. Chol/HDL Ratio                                             Men  Women                               1/2 Avg.Risk  3.4    3.3                                   Avg.Risk  5.0    4.4                                2X Avg.Risk  9.6    7.1                                3X Avg.Risk 23.4   11.0    Estimated CHD Risk 0.8 0.0 - 1.0 times avg.    Comment: The CHD Risk is based on the T. Chol/HDL ratio. Other factors affect CHD Risk such  as hypertension, smoking, diabetes, severe obesity, and family history of premature CHD.    TSH  2.990 0.450 - 4.500 uIU/mL   T4, Total 7.1 4.5 - 12.0 ug/dL   T3 Uptake Ratio 26 24 - 39 %   Free Thyroxine Index 1.8 1.2 - 4.9   WBC 5.2 3.4 - 10.8 x10E3/uL   RBC 4.47 3.77 - 5.28 x10E6/uL   Hemoglobin 13.4 11.1 - 15.9 g/dL   Hematocrit 39.9 34.0 - 46.6 %   MCV 89 79 - 97 fL   MCH 30.0 26.6 - 33.0 pg   MCHC 33.6 31.5 - 35.7 g/dL   RDW 12.8 11.7 - 15.4 %   Platelets 361 150 - 450 x10E3/uL   Neutrophils 52 Not Estab. %   Lymphs 31 Not Estab. %   Monocytes 12 Not Estab. %   Eos 4 Not Estab. %   Basos 1 Not Estab. %   Neutrophils Absolute 2.7 1.4 - 7.0 x10E3/uL   Lymphocytes Absolute 1.6 0.7 - 3.1 x10E3/uL   Monocytes Absolute 0.6 0.1 - 0.9 x10E3/uL   EOS (ABSOLUTE) 0.2 0.0 - 0.4 x10E3/uL   Basophils Absolute 0.1 0.0 - 0.2 x10E3/uL   Immature Granulocytes 0 Not Estab. %   Immature Grans (Abs) 0.0 0.0 - 0.1 x10E3/uL  Hgb A1c w/o eAG     Status: Abnormal   Collection Time: 12/18/21  7:53 AM  Result Value Ref Range   Hgb A1c MFr Bld 6.0 (H) 4.8 - 5.6 %    Comment:          Prediabetes: 5.7 - 6.4          Diabetes: >6.4          Glycemic control for adults with diabetes: <7.0     Medications started in this visit:   Meds ordered this encounter  Medications   simvastatin (ZOCOR) 10 MG tablet    Sig: Take 1 tablet (10 mg total) by mouth at bedtime.    Dispense:  90 tablet    Refill:  3

## 2022-05-20 ENCOUNTER — Other Ambulatory Visit: Payer: Self-pay | Admitting: Nurse Practitioner

## 2022-05-20 DIAGNOSIS — I10 Essential (primary) hypertension: Secondary | ICD-10-CM

## 2022-08-15 ENCOUNTER — Other Ambulatory Visit: Payer: Self-pay | Admitting: Nurse Practitioner

## 2022-08-15 DIAGNOSIS — I10 Essential (primary) hypertension: Secondary | ICD-10-CM

## 2022-09-21 ENCOUNTER — Encounter: Payer: Self-pay | Admitting: Physician Assistant

## 2022-09-21 ENCOUNTER — Ambulatory Visit (INDEPENDENT_AMBULATORY_CARE_PROVIDER_SITE_OTHER): Payer: Self-pay | Admitting: Physician Assistant

## 2022-09-21 VITALS — BP 116/76 | HR 77 | Temp 97.5°F | Ht 66.0 in | Wt 171.4 lb

## 2022-09-21 DIAGNOSIS — J069 Acute upper respiratory infection, unspecified: Secondary | ICD-10-CM

## 2022-09-21 DIAGNOSIS — J040 Acute laryngitis: Secondary | ICD-10-CM

## 2022-09-21 NOTE — Progress Notes (Signed)
[facility] [address] [place], [place] [address]   Office Visit Note  Patient Name: Natalie Vargas Date of Birth [id number]  Medical Record number [id number]  Date of Service: 09/21/2022  Chief Complaint  Patient presents with   Sore Throat    Sick for 3 weeks. Throat started hurting yesterday. Lost voice this morning. Congested and cough that started 3 weeks. Mucus has been clear up until this weekend it is green now.      56 y/o F presents to the clinic for c/o sore throat, hoarseness, cough x 2-3 days, but her congestion has been ongoing for the past 2 3 weeks. She denies CP, SOB, body aches, fever. She denies known exposure to strep, flu, or covid. Tested negative for covid. She denies h/o GERD or tobacco use.   Sore Throat  Associated symptoms include congestion. Pertinent negatives include no trouble swallowing.      Current Medication:  Outpatient Encounter Medications as of 09/21/2022  Medication Sig   Cholecalciferol (VITAMIN D PO) Take 1,000 mg by mouth 2 (two) times daily.   hydrochlorothiazide (HYDRODIURIL) 25 MG tablet Take 1 tablet by mouth once daily   Multiple Vitamin (MULTIVITAMIN) tablet Take 1 tablet by mouth daily.   Omega-3 Fatty Acids (FISH OIL) 1000 MG CAPS Take 1,000 mg by mouth daily.   simvastatin (ZOCOR) 10 MG tablet Take 1 tablet (10 mg total) by mouth at bedtime.   No facility-administered encounter medications on file as of 09/21/2022.      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   HENT:  Positive for congestion, postnasal drip and sore throat. Negative for rhinorrhea, sinus pressure, sinus pain and trouble swallowing.   Eyes: Negative.   Respiratory: Negative.    Neurological: Negative.     Physical Exam Constitutional:      Appearance: Normal appearance.  HENT:     Head: Atraumatic.     Right Ear: Tympanic membrane, ear canal and external ear normal.     Left Ear: Tympanic membrane, ear canal and external ear normal.     Nose: Nose normal.     Mouth/Throat:     Mouth: Mucous membranes are moist.     Pharynx: Oropharynx is clear.  Eyes:     Extraocular Movements: Extraocular movements intact.  Cardiovascular:     Rate and Rhythm: Normal rate and regular rhythm.  Pulmonary:     Effort: Pulmonary effort is normal.     Breath sounds: Normal breath sounds.  Musculoskeletal:     Cervical back: Neck supple.  Skin:    General: Skin is warm.  Neurological:     Mental Status: She is alert.  Psychiatric:        Mood and Affect: Mood normal.        Behavior: Behavior normal.        Thought Content: Thought  content normal.        Judgment: Judgment normal.       Assessment/Plan:  1. Viral upper respiratory tract infection  2. Laryngitis  Increase fluids Humidifier REST voice Eat soft ice cream or warm water gargles Throat logenzes Otc ibuprofen or tylenol for severe throat pain Otc mucinex or Tylneol sinus & cold flu symptom medicine as directed on the box Continue to watch for worsening symptoms Briefly discussed oral steroids if symptoms don' improve.  RTC prn Pt verbalized understanding and in agreement.    General Counseling: marie borowski understanding of the findings of todays visit and agrees with plan of treatment. I have discussed any further diagnostic evaluation that may be needed or ordered today. We also reviewed her medications today. she has been encouraged to call the office with any  questions or concerns that should arise related to todays visit.    Time spent:20 Minutes    Gilberto Better, [place] Physician Assistant

## 2022-10-21 ENCOUNTER — Encounter: Payer: Self-pay | Admitting: Nurse Practitioner

## 2022-10-21 DIAGNOSIS — Z1231 Encounter for screening mammogram for malignant neoplasm of breast: Secondary | ICD-10-CM

## 2022-10-22 ENCOUNTER — Other Ambulatory Visit: Payer: Self-pay | Admitting: Nurse Practitioner

## 2022-10-22 DIAGNOSIS — Z1231 Encounter for screening mammogram for malignant neoplasm of breast: Secondary | ICD-10-CM

## 2022-11-05 ENCOUNTER — Ambulatory Visit
Admission: RE | Admit: 2022-11-05 | Discharge: 2022-11-05 | Disposition: A | Discharge status: Home / Self Care | Payer: BC Managed Care – PPO | Source: Ambulatory Visit | Attending: Nurse Practitioner | Admitting: Nurse Practitioner

## 2022-11-05 DIAGNOSIS — Z1231 Encounter for screening mammogram for malignant neoplasm of breast: Secondary | ICD-10-CM | POA: Diagnosis present

## 2022-11-11 ENCOUNTER — Encounter: Payer: Self-pay | Admitting: Nurse Practitioner

## 2023-01-04 ENCOUNTER — Other Ambulatory Visit: Payer: Self-pay | Admitting: Physician Assistant

## 2023-01-04 ENCOUNTER — Encounter: Payer: Self-pay | Admitting: Nurse Practitioner

## 2023-01-04 DIAGNOSIS — I10 Essential (primary) hypertension: Secondary | ICD-10-CM

## 2023-01-04 MED ORDER — HYDROCHLOROTHIAZIDE 25 MG PO TABS: 25.0000 mg | ORAL_TABLET | Freq: Every day | ORAL | 0 refills | Status: AC

## 2023-01-04 NOTE — Telephone Encounter (Signed)
Pt requested a Rx for HCTZ until she f/u with her new PCP in May. Rx sent to preferred pharmacy.

## 2023-04-14 ENCOUNTER — Other Ambulatory Visit: Payer: Self-pay

## 2023-04-14 DIAGNOSIS — Z0000 Encounter for general adult medical examination without abnormal findings: Secondary | ICD-10-CM

## 2023-04-14 DIAGNOSIS — Z8639 Personal history of other endocrine, nutritional and metabolic disease: Secondary | ICD-10-CM

## 2023-04-14 DIAGNOSIS — R7303 Prediabetes: Secondary | ICD-10-CM

## 2023-04-14 DIAGNOSIS — E782 Mixed hyperlipidemia: Secondary | ICD-10-CM

## 2023-04-15 ENCOUNTER — Encounter: Payer: Self-pay | Admitting: Nurse Practitioner

## 2023-04-15 ENCOUNTER — Other Ambulatory Visit: Payer: Self-pay

## 2023-04-15 DIAGNOSIS — E782 Mixed hyperlipidemia: Secondary | ICD-10-CM

## 2023-04-15 DIAGNOSIS — R7303 Prediabetes: Secondary | ICD-10-CM

## 2023-04-15 DIAGNOSIS — Z0000 Encounter for general adult medical examination without abnormal findings: Secondary | ICD-10-CM

## 2023-04-15 DIAGNOSIS — Z8639 Personal history of other endocrine, nutritional and metabolic disease: Secondary | ICD-10-CM

## 2023-04-16 LAB — CBC WITH DIFFERENTIAL/PLATELET
EOS (ABSOLUTE): 0.1 10*3/uL (ref 0.0–0.4)
Hematocrit: 38.6 % (ref 34.0–46.6)
Hemoglobin: 13.2 g/dL (ref 11.1–15.9)
Lymphocytes Absolute: 1.2 10*3/uL (ref 0.7–3.1)
Platelets: 325 10*3/uL (ref 150–450)

## 2023-04-16 LAB — COMPREHENSIVE METABOLIC PANEL

## 2023-04-16 LAB — LIPID PANEL WITH LDL/HDL RATIO

## 2023-04-16 LAB — HEMOGLOBIN A1C: Hgb A1c MFr Bld: 6 % — ABNORMAL HIGH (ref 4.8–5.6)

## 2023-04-16 LAB — URINALYSIS, ROUTINE W REFLEX MICROSCOPIC
Leukocytes,UA: NEGATIVE
Nitrite, UA: NEGATIVE
Urobilinogen, Ur: 0.2 mg/dL (ref 0.2–1.0)

## 2023-04-17 LAB — CBC WITH DIFFERENTIAL/PLATELET
Basophils Absolute: 0.1 10*3/uL (ref 0.0–0.2)
Basos: 1 %
Eos: 2 %
Immature Grans (Abs): 0 10*3/uL (ref 0.0–0.1)
Immature Granulocytes: 0 %
Lymphs: 26 %
MCH: 31 pg (ref 26.6–33.0)
MCHC: 34.2 g/dL (ref 31.5–35.7)
MCV: 91 fL (ref 79–97)
Monocytes Absolute: 0.5 10*3/uL (ref 0.1–0.9)
Monocytes: 11 %
Neutrophils Absolute: 2.8 10*3/uL (ref 1.4–7.0)
Neutrophils: 60 %
RBC: 4.26 x10E6/uL (ref 3.77–5.28)
RDW: 13 % (ref 11.7–15.4)
WBC: 4.7 10*3/uL (ref 3.4–10.8)

## 2023-04-17 LAB — URINALYSIS, ROUTINE W REFLEX MICROSCOPIC
Bilirubin, UA: NEGATIVE
Glucose, UA: NEGATIVE
Ketones, UA: NEGATIVE
Protein,UA: NEGATIVE
RBC, UA: NEGATIVE
Specific Gravity, UA: 1.018 (ref 1.005–1.030)
pH, UA: 7 (ref 5.0–7.5)

## 2023-04-17 LAB — COMPREHENSIVE METABOLIC PANEL
ALT: 12 IU/L (ref 0–32)
AST: 19 IU/L (ref 0–40)
Albumin/Globulin Ratio: 1.6 (ref 1.2–2.2)
Albumin: 4.4 g/dL (ref 3.8–4.9)
BUN/Creatinine Ratio: 16 (ref 9–23)
BUN: 16 mg/dL (ref 6–24)
Bilirubin Total: <0.2 mg/dL (ref 0.0–1.2)
CO2: 24 mmol/L (ref 20–29)
Globulin, Total: 2.8 g/dL (ref 1.5–4.5)
Glucose: 105 mg/dL — ABNORMAL HIGH (ref 70–99)
eGFR: 64 mL/min/{1.73_m2} (ref >59)

## 2023-04-17 LAB — HEMOGLOBIN A1C: Est. average glucose Bld gHb Est-mCnc: 126 mg/dL

## 2023-04-17 LAB — TSH: TSH: 2.92 u[IU]/mL (ref 0.450–4.500)

## 2023-04-17 LAB — LIPID PANEL WITH LDL/HDL RATIO
LDL Chol Calc (NIH): 99 mg/dL (ref 0–99)
Triglycerides: 113 mg/dL (ref 0–149)
VLDL Cholesterol Cal: 20 mg/dL (ref 5–40)

## 2023-04-21 LAB — COLOGUARD: COLOGUARD: NEGATIVE

## 2023-04-21 LAB — EXTERNAL GENERIC LAB PROCEDURE: COLOGUARD: NEGATIVE

## 2023-05-24 IMAGING — MG MM DIGITAL SCREENING BILAT W/ TOMO AND CAD
8 series · 8 of 24 positions shown · non-contrast
Comparison: Previous exam(s).

CLINICAL DATA: Screening.

EXAM:
DIGITAL SCREENING BILATERAL MAMMOGRAM WITH TOMOSYNTHESIS AND CAD
TECHNIQUE: Bilateral screening digital craniocaudal and mediolateral oblique
mammograms were obtained. Bilateral screening digital breast
tomosynthesis was performed. The images were evaluated with
computer-aided detection.

[R MLO synth-2D]
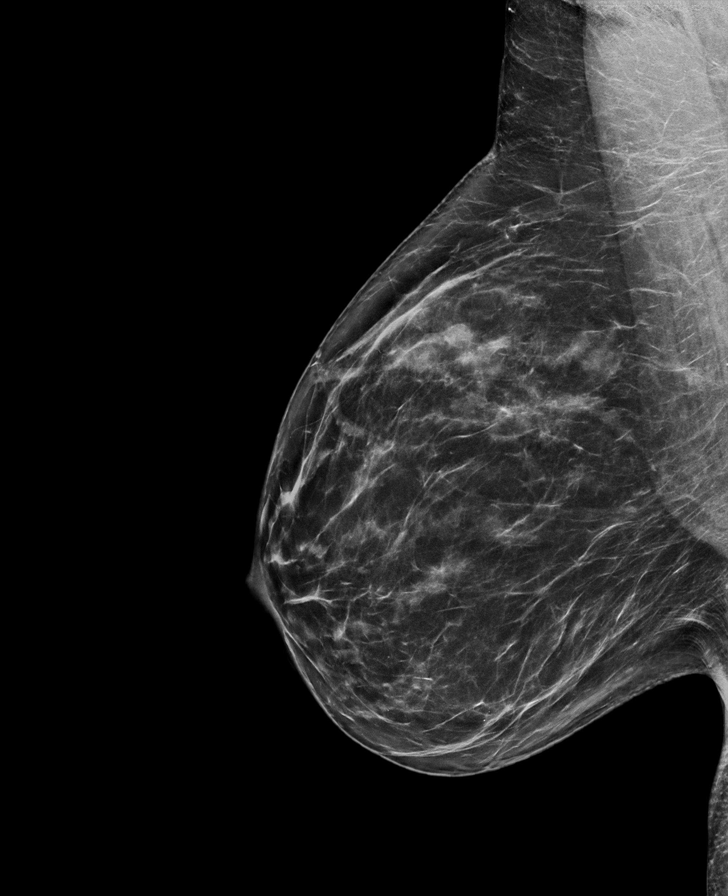

[L MLO synth-2D]
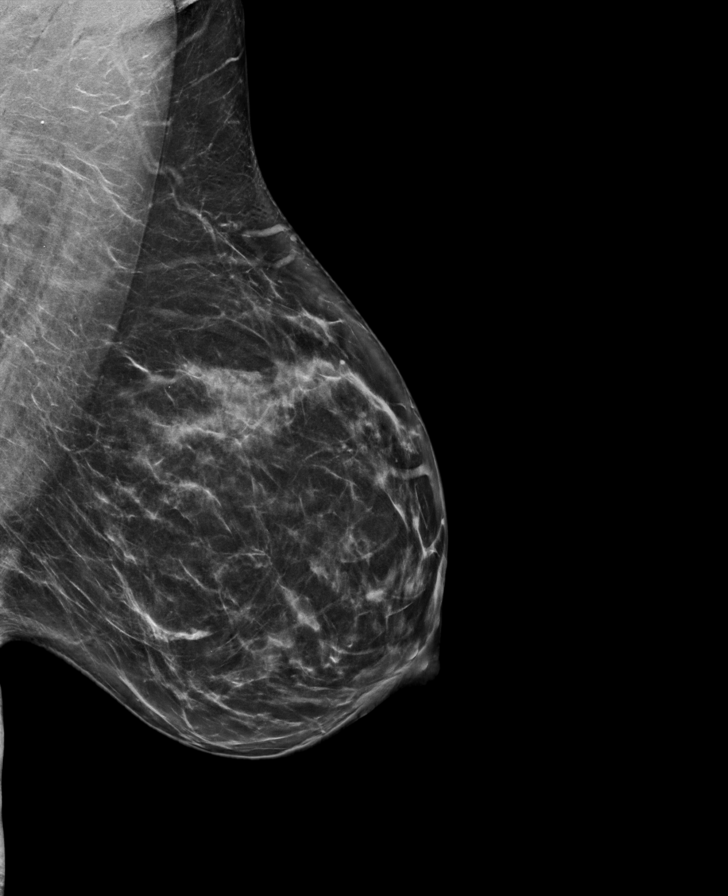

[L CC synth-2D]
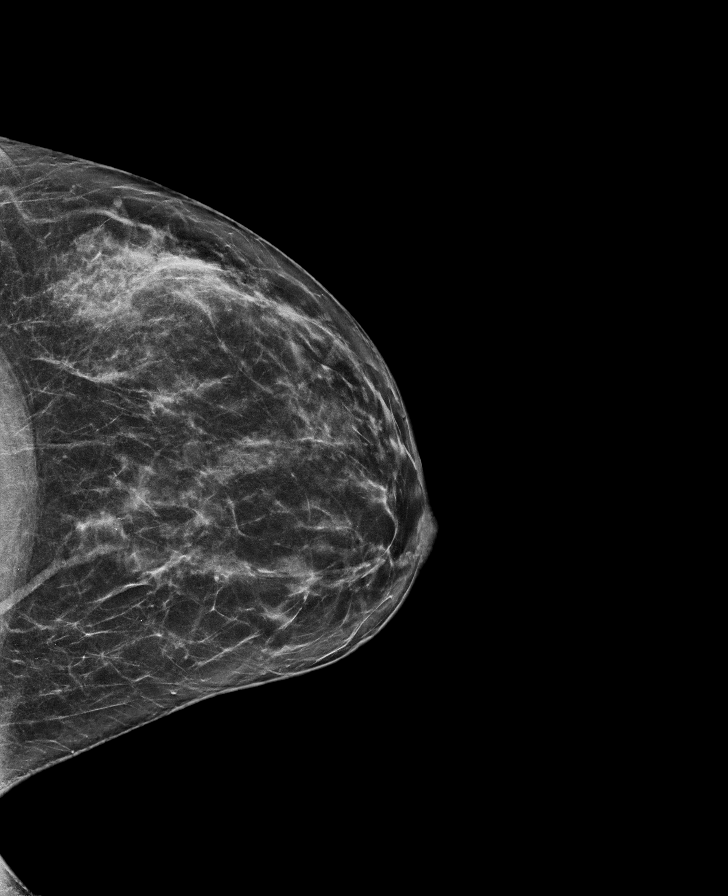

[R CC synth-2D]
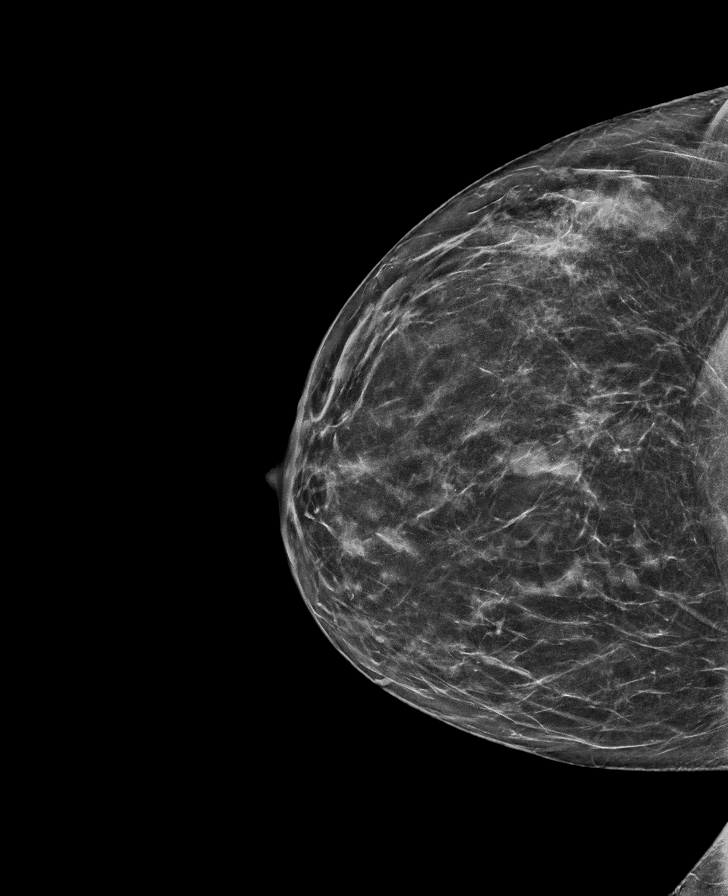

[R CC tomo · tomo slice 35/69.0]
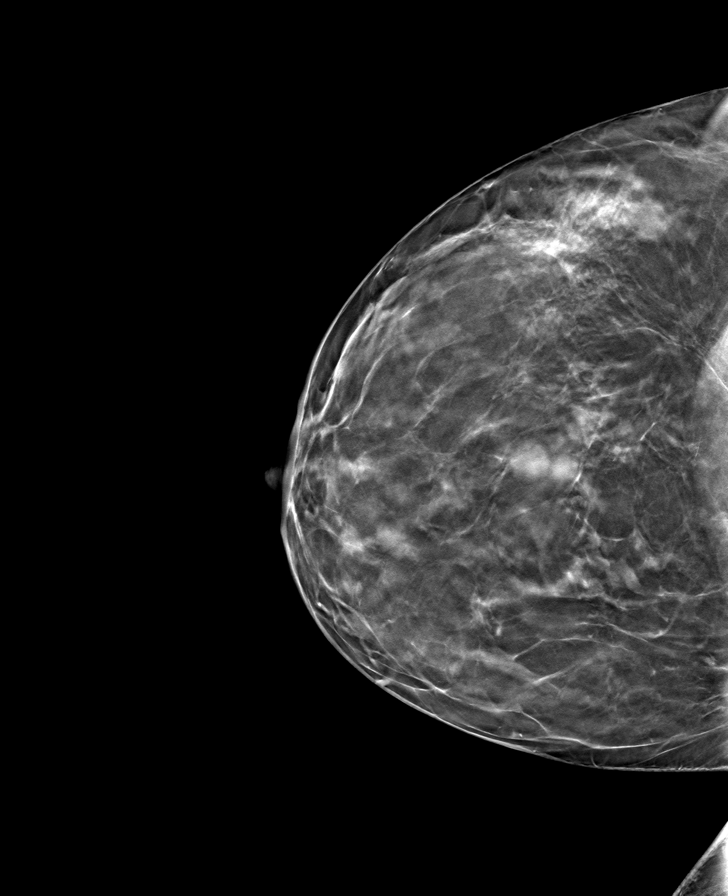

[L CC tomo · tomo slice 35/70.0]
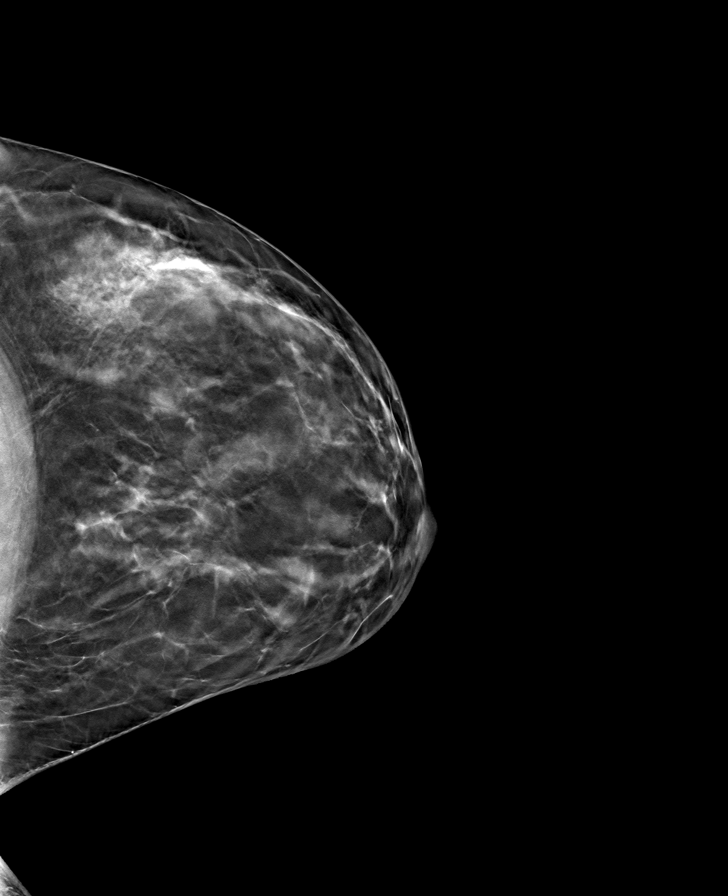

[R MLO tomo · tomo slice 39/78.0]
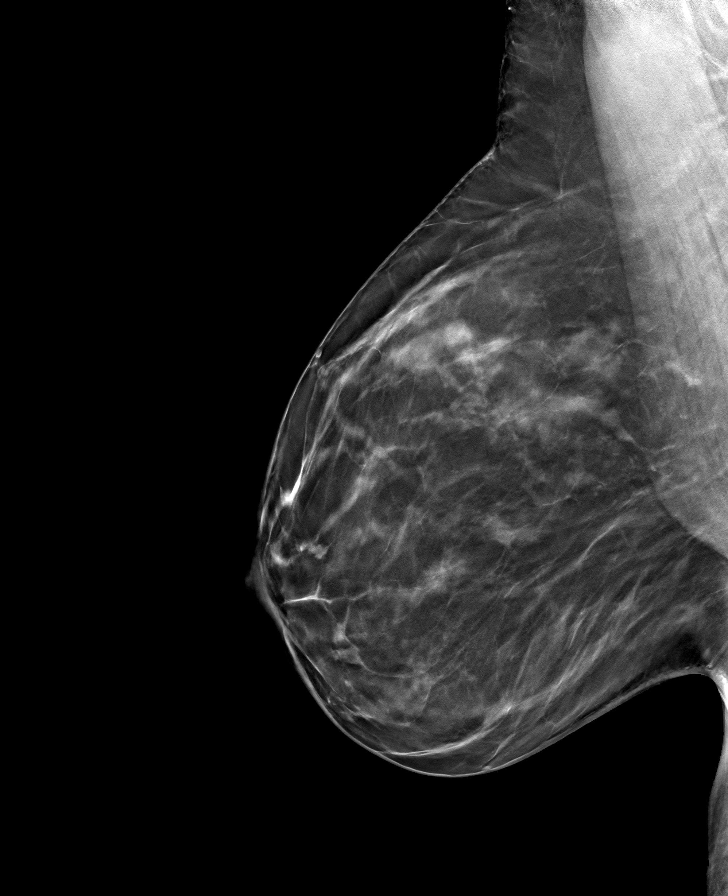

[L MLO tomo · tomo slice 39/77.0]
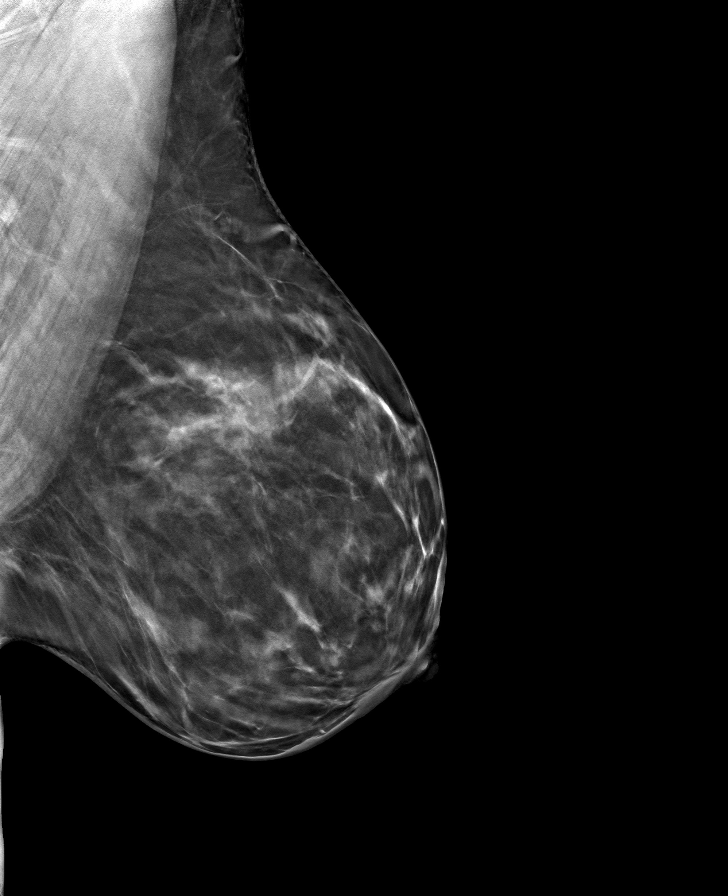

[8 of 24 positions shown; findings below may reference images not displayed]

ACR Breast Density Category c: The breast tissue is heterogeneously
dense, which may obscure small masses.
FINDINGS: There are no findings suspicious for malignancy.
IMPRESSION: No mammographic evidence of malignancy. A result letter of this
screening mammogram will be mailed directly to the patient.

RECOMMENDATION:
Screening mammogram in one year. (Code:Q3-W-BC3)

BI-RADS CATEGORY  1: Negative.

## 2023-09-22 ENCOUNTER — Other Ambulatory Visit: Payer: Self-pay | Admitting: Family Medicine

## 2023-09-22 DIAGNOSIS — Z1231 Encounter for screening mammogram for malignant neoplasm of breast: Secondary | ICD-10-CM

## 2023-09-27 ENCOUNTER — Encounter: Payer: Self-pay | Admitting: Physician Assistant

## 2023-09-27 ENCOUNTER — Ambulatory Visit (INDEPENDENT_AMBULATORY_CARE_PROVIDER_SITE_OTHER): Payer: Self-pay | Admitting: Physician Assistant

## 2023-09-27 ENCOUNTER — Other Ambulatory Visit: Payer: Self-pay

## 2023-09-27 VITALS — BP 120/84 | HR 67 | Temp 97.8°F | Ht 66.0 in | Wt 180.0 lb

## 2023-09-27 DIAGNOSIS — R051 Acute cough: Secondary | ICD-10-CM

## 2023-09-27 DIAGNOSIS — J0100 Acute maxillary sinusitis, unspecified: Secondary | ICD-10-CM

## 2023-09-27 MED ORDER — DOXYCYCLINE HYCLATE 100 MG PO TABS: 100.0000 mg | ORAL_TABLET | Freq: Two times a day (BID) | ORAL | 0 refills | Status: AC

## 2023-09-27 MED ORDER — BENZONATATE 200 MG PO CAPS: 200.0000 mg | ORAL_CAPSULE | Freq: Three times a day (TID) | ORAL | 0 refills | Status: AC | PRN

## 2023-09-27 NOTE — Progress Notes (Signed)
[facility] [address] [place], [place] [address]   Office Visit Note  Patient Name: Natalie Vargas Date of Birth [id number]  Medical Record number [id number]  Date of Service: 09/27/2023  Chief Complaint  Patient presents with   Acute Visit    Patient c/o persistent mucous production that feels like it is sitting in her throat and a dry cough. Symptoms have been persistent for the past month. She is taking Nyquil at night and Mucinex-D. She has also been using Flonase and taking loratadine daily but has not seen any improvement in symptoms. The past few days she has been feeling pain in her L ear and the left side of jaw.      57 y/o F presents to the clinic for c/o sinus pressure, pain, post nasal drainage, and cough x almost 4 weeks. Has tried multiple otc medications to help with symptoms, but not helping much. Denies CP, SOB, wheezing, body aches.       Current Medication:  Outpatient Encounter Medications as of 09/27/2023  Medication Sig   benzonatate (TESSALON) 200 MG capsule Take 1 capsule (200 mg total) by mouth 3 (three) times daily as needed for cough.   Cholecalciferol (VITAMIN D PO) Take 1,000 mg by mouth 2 (two) times daily.   doxycycline (VIBRA-TABS) 100 MG tablet Take 1 tablet (100 mg total) by mouth 2 (two) times daily.   fluticasone (FLONASE) 50 MCG/ACT nasal spray Place 1 spray into both nostrils daily.   hydrochlorothiazide (HYDRODIURIL) 25 MG tablet Take 1 tablet (25 mg total) by mouth daily.   loratadine (CLARITIN) 10 MG tablet Take 10 mg by mouth daily.   Multiple Vitamin (MULTIVITAMIN) tablet Take 1 tablet by mouth daily.   Omega-3 Fatty Acids (FISH OIL) 1000 MG CAPS Take 1,000 mg by mouth daily.   simvastatin (ZOCOR) 10 MG tablet Take 1 tablet (10 mg total) by mouth at bedtime.   No facility-administered encounter medications on file as of 09/27/2023.      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   HENT:  Positive for congestion, sinus pressure and sinus pain. Negative for sore throat and trouble swallowing.   Respiratory:  Positive for cough.   Cardiovascular: Negative.   Neurological: Negative.     Physical Exam Constitutional:      Appearance: Normal appearance.  HENT:     Head: Atraumatic.     Right Ear: Tympanic membrane, ear canal and external ear normal.     Left Ear: Tympanic membrane, ear canal and external ear normal.     Nose:     Right Sinus: Maxillary sinus tenderness and frontal sinus tenderness present.     Left Sinus: Maxillary sinus tenderness and frontal sinus tenderness present.     Mouth/Throat:     Mouth: Mucous membranes are moist.     Pharynx: Oropharynx is clear.  Eyes:     Extraocular  Movements: Extraocular movements intact.  Cardiovascular:     Rate and Rhythm: Normal rate and regular rhythm.  Pulmonary:     Effort: Pulmonary effort is normal.     Breath sounds: Normal breath sounds.  Musculoskeletal:     Cervical back: Neck supple.  Skin:    General: Skin is warm.  Neurological:     Mental Status: She is alert.  Psychiatric:        Mood and Affect: Mood normal.        Behavior: Behavior normal.        Thought Content: Thought content normal.        Judgment: Judgment normal.       Assessment/Plan:  1. Acute non-recurrent maxillary sinusitis - doxycycline (VIBRA-TABS) 100 MG tablet; Take 1 tablet (100 mg total) by mouth 2 (two) times daily.  Dispense: 14 tablet; Refill: 0  2. Acute cough - benzonatate (TESSALON) 200 MG  capsule; Take 1 capsule (200 mg total) by mouth 3 (three) times daily as needed for cough.  Dispense: 30 capsule; Refill: 0  Increase fluids Take medicines as prescribed. Continue to watch for worsening symptoms. RTC prn. Pt verbalized understanding and in agreement.   General Counseling: sharica nadell understanding of the findings of todays visit and agrees with plan of treatment. I have discussed any further diagnostic evaluation that may be needed or ordered today. We also reviewed her medications today. she has been encouraged to call the office with any questions or concerns that should arise related to todays visit.    Time spent:20 Minutes    Gilberto Better, [place] Physician Assistant

## 2023-11-11 ENCOUNTER — Ambulatory Visit
Admission: RE | Admit: 2023-11-11 | Discharge: 2023-11-11 | Disposition: A | Discharge status: Home / Self Care | Payer: BC Managed Care – PPO | Source: Ambulatory Visit | Attending: Family Medicine | Admitting: Family Medicine

## 2023-11-11 DIAGNOSIS — Z1231 Encounter for screening mammogram for malignant neoplasm of breast: Secondary | ICD-10-CM | POA: Insufficient documentation

## 2024-05-03 ENCOUNTER — Other Ambulatory Visit: Payer: Self-pay

## 2024-05-03 DIAGNOSIS — R7303 Prediabetes: Secondary | ICD-10-CM

## 2024-05-03 DIAGNOSIS — E782 Mixed hyperlipidemia: Secondary | ICD-10-CM

## 2024-05-03 DIAGNOSIS — Z0000 Encounter for general adult medical examination without abnormal findings: Secondary | ICD-10-CM

## 2024-05-03 DIAGNOSIS — Z8639 Personal history of other endocrine, nutritional and metabolic disease: Secondary | ICD-10-CM

## 2024-05-03 NOTE — Addendum Note (Signed)
 Addended by: NEWCOMER MCCLAIN, Vianka Ertel L on: 05/03/2024 08:19 AM   Modules accepted: Orders

## 2024-05-04 LAB — URINALYSIS, ROUTINE W REFLEX MICROSCOPIC
Bilirubin, UA: NEGATIVE
Glucose, UA: NEGATIVE
Ketones, UA: NEGATIVE
Leukocytes,UA: NEGATIVE
Nitrite, UA: NEGATIVE
Protein,UA: NEGATIVE
RBC, UA: NEGATIVE
Specific Gravity, UA: <=1.005 — AB (ref 1.005–1.030)
Urobilinogen, Ur: 0.2 mg/dL (ref 0.2–1.0)
pH, UA: 6.5 (ref 5.0–7.5)

## 2024-05-04 LAB — CBC WITH DIFFERENTIAL/PLATELET
Basophils Absolute: 0.1 10*3/uL (ref 0.0–0.2)
Basos: 1 %
EOS (ABSOLUTE): 0.2 10*3/uL (ref 0.0–0.4)
Eos: 3 %
Hematocrit: 39.5 % (ref 34.0–46.6)
Hemoglobin: 12.7 g/dL (ref 11.1–15.9)
Immature Grans (Abs): 0 10*3/uL (ref 0.0–0.1)
Immature Granulocytes: 0 %
Lymphocytes Absolute: 1.7 10*3/uL (ref 0.7–3.1)
Lymphs: 35 %
MCH: 29.9 pg (ref 26.6–33.0)
MCHC: 32.2 g/dL (ref 31.5–35.7)
MCV: 93 fL (ref 79–97)
Monocytes Absolute: 0.6 10*3/uL (ref 0.1–0.9)
Monocytes: 12 %
Neutrophils Absolute: 2.5 10*3/uL (ref 1.4–7.0)
Neutrophils: 49 %
Platelets: 334 10*3/uL (ref 150–450)
RBC: 4.25 x10E6/uL (ref 3.77–5.28)
RDW: 13.9 % (ref 11.7–15.4)
WBC: 5 10*3/uL (ref 3.4–10.8)

## 2024-05-04 LAB — SPECIMEN STATUS REPORT

## 2024-05-05 LAB — COMPREHENSIVE METABOLIC PANEL WITH GFR
ALT: 12 IU/L (ref 0–32)
AST: 19 IU/L (ref 0–40)
Albumin: 4.4 g/dL (ref 3.8–4.9)
Alkaline Phosphatase: 101 IU/L (ref 44–121)
BUN/Creatinine Ratio: 17 (ref 9–23)
BUN: 17 mg/dL (ref 6–24)
Bilirubin Total: 0.3 mg/dL (ref 0.0–1.2)
CO2: 22 mmol/L (ref 20–29)
Calcium: 10 mg/dL (ref 8.7–10.2)
Chloride: 101 mmol/L (ref 96–106)
Creatinine, Ser: 1.03 mg/dL — ABNORMAL HIGH (ref 0.57–1.00)
Globulin, Total: 2.7 g/dL (ref 1.5–4.5)
Glucose: 96 mg/dL (ref 70–99)
Potassium: 3.9 mmol/L (ref 3.5–5.2)
Sodium: 140 mmol/L (ref 134–144)
Total Protein: 7.1 g/dL (ref 6.0–8.5)
eGFR: 63 mL/min/{1.73_m2} (ref >59)

## 2024-05-05 LAB — HEMOGLOBIN A1C
Est. average glucose Bld gHb Est-mCnc: 126 mg/dL
Hgb A1c MFr Bld: 6 — AB (ref 4.8–5.6)

## 2024-05-05 LAB — LIPID PANEL
Chol/HDL Ratio: 3.1 ratio (ref 0.0–4.4)
Cholesterol, Total: 160 mg/dL (ref 100–199)
HDL: 51 mg/dL (ref >39)
LDL Chol Calc (NIH): 89 mg/dL (ref 0–99)
Triglycerides: 109 mg/dL (ref 0–149)
VLDL Cholesterol Cal: 20 mg/dL (ref 5–40)

## 2024-05-05 LAB — VITAMIN D 25 HYDROXY (VIT D DEFICIENCY, FRACTURES): Vit D, 25-Hydroxy: 38.4 ng/mL (ref 30.0–100.0)

## 2024-05-05 LAB — TSH: TSH: 3.05 u[IU]/mL (ref 0.450–4.500)

## 2024-10-02 ENCOUNTER — Other Ambulatory Visit: Payer: Self-pay | Admitting: Family Medicine

## 2024-10-02 DIAGNOSIS — Z1231 Encounter for screening mammogram for malignant neoplasm of breast: Secondary | ICD-10-CM

## 2024-12-06 ENCOUNTER — Ambulatory Visit
Admission: RE | Admit: 2024-12-06 | Discharge: 2024-12-06 | Disposition: A | Discharge status: Home / Self Care | Source: Ambulatory Visit | Attending: Family Medicine | Admitting: Family Medicine

## 2024-12-06 DIAGNOSIS — Z1231 Encounter for screening mammogram for malignant neoplasm of breast: Secondary | ICD-10-CM | POA: Insufficient documentation
